# Patient Record
Sex: Female | Born: 1935
Health system: Southern US, Community
[De-identification: ages and names within clinical notes are randomized; demographics above are authoritative.]

## PROBLEM LIST (undated history)

## (undated) DIAGNOSIS — E669 Obesity, unspecified: Secondary | ICD-10-CM

## (undated) DIAGNOSIS — E039 Hypothyroidism, unspecified: Secondary | ICD-10-CM

## (undated) DIAGNOSIS — I10 Essential (primary) hypertension: Secondary | ICD-10-CM

## (undated) DIAGNOSIS — E785 Hyperlipidemia, unspecified: Secondary | ICD-10-CM

## (undated) DIAGNOSIS — R011 Cardiac murmur, unspecified: Secondary | ICD-10-CM

## (undated) DIAGNOSIS — R001 Bradycardia, unspecified: Secondary | ICD-10-CM

## (undated) DIAGNOSIS — H409 Unspecified glaucoma: Secondary | ICD-10-CM

## (undated) HISTORY — DX: Essential (primary) hypertension: I10

## (undated) HISTORY — DX: Cardiac murmur, unspecified: R01.1

## (undated) HISTORY — DX: Hypothyroidism, unspecified: E03.9

## (undated) HISTORY — DX: Obesity, unspecified: E66.9

## (undated) HISTORY — DX: Bradycardia, unspecified: R00.1

## (undated) HISTORY — DX: Unspecified glaucoma: H40.9

## (undated) HISTORY — DX: Hyperlipidemia, unspecified: E78.5

---

## 1999-03-04 ENCOUNTER — Other Ambulatory Visit: Admission: RE | Admit: 1999-03-04 | Discharge: 1999-03-04 | Payer: Self-pay | Admitting: Family Medicine

## 2002-05-06 ENCOUNTER — Inpatient Hospital Stay (HOSPITAL_COMMUNITY): Admission: AD | Admit: 2002-05-06 | Discharge: 2002-05-08 | Payer: Self-pay | Admitting: Family Medicine

## 2002-05-06 ENCOUNTER — Encounter: Payer: Self-pay | Admitting: Family Medicine

## 2002-05-07 ENCOUNTER — Encounter (INDEPENDENT_AMBULATORY_CARE_PROVIDER_SITE_OTHER): Payer: Self-pay | Admitting: Specialist

## 2002-05-07 ENCOUNTER — Encounter: Payer: Self-pay | Admitting: Family Medicine

## 2002-05-08 ENCOUNTER — Encounter (INDEPENDENT_AMBULATORY_CARE_PROVIDER_SITE_OTHER): Payer: Self-pay | Admitting: *Deleted

## 2004-08-24 ENCOUNTER — Other Ambulatory Visit: Admission: RE | Admit: 2004-08-24 | Discharge: 2004-08-24 | Payer: Self-pay | Admitting: Family Medicine

## 2005-07-07 ENCOUNTER — Encounter (INDEPENDENT_AMBULATORY_CARE_PROVIDER_SITE_OTHER): Payer: Self-pay | Admitting: *Deleted

## 2005-07-07 ENCOUNTER — Ambulatory Visit (HOSPITAL_COMMUNITY): Admission: RE | Admit: 2005-07-07 | Discharge: 2005-07-07 | Payer: Self-pay | Admitting: Family Medicine

## 2006-07-27 HISTORY — PX: NM MYOVIEW LTD: HXRAD82

## 2006-08-21 ENCOUNTER — Inpatient Hospital Stay (HOSPITAL_COMMUNITY): Admission: RE | Admit: 2006-08-21 | Discharge: 2006-08-24 | Payer: Self-pay | Admitting: Orthopedic Surgery

## 2006-08-21 HISTORY — PX: REPLACEMENT TOTAL KNEE: SUR1224

## 2006-10-04 ENCOUNTER — Encounter (HOSPITAL_COMMUNITY): Admission: RE | Admit: 2006-10-04 | Discharge: 2007-01-02 | Payer: Self-pay | Admitting: Internal Medicine

## 2007-06-25 HISTORY — PX: US ECHOCARDIOGRAPHY: HXRAD669

## 2007-08-27 ENCOUNTER — Inpatient Hospital Stay (HOSPITAL_COMMUNITY): Admission: RE | Admit: 2007-08-27 | Discharge: 2007-08-30 | Payer: Self-pay | Admitting: Orthopedic Surgery

## 2010-02-10 ENCOUNTER — Encounter: Admission: RE | Admit: 2010-02-10 | Discharge: 2010-02-10 | Payer: Self-pay | Admitting: Gastroenterology

## 2010-07-11 ENCOUNTER — Encounter: Payer: Self-pay | Admitting: Gastroenterology

## 2010-11-02 NOTE — Op Note (Signed)
Alexandra Dixon, Alexandra Dixon               ACCOUNT NO.:  [id number]   MEDICAL RECORD NO.:  [id number]          PATIENT TYPE:  INP   LOCATION:  2550                         FACILITY:  [facility]   PHYSICIAN:  Mila Homer. Sherlean Foot, M.D. DATE OF BIRTH:  1935/06/30   DATE OF PROCEDURE:  08/27/2007  DATE OF DISCHARGE:                               OPERATIVE REPORT   SURGEON:  Mila Homer. Sherlean Foot, M.D.   ASSISTANTArlys John D. Petrarca, P.A.-C.   ANESTHESIA:  General.   PREOPERATIVE DIAGNOSIS:  Left knee osteoarthritis.   POSTOPERATIVE DIAGNOSIS:  Left knee osteoarthritis.   PROCEDURE:  Left total knee arthroplasty.   INDICATIONS FOR PROCEDURE:  The patient is 75 year old black female  __________  managed for osteoarthritis and a successful total knee done  on the other side.  Informed consent was obtained.   DESCRIPTION OF PROCEDURE:  The patient was laid supine, administered  general anesthesia.  Foley catheter placed.  Left leg was prepped and  draped in usual sterile fashion.  The extremity was exsanguinated with  the Esmarch and the tourniquet inflated to 375 mmHg.  I then made a  midline incision with a #10 blade.  I used a new blade to make a median  parapatellar arthrotomy and performed synovectomy.  I then elevated the  deep MCL off the medial crest of the tibia as well as doing a bit of a  varus release since this was a significantly varus knee.  There was a  lot of medial tibial plateau bone loss.  I then used patellar reamer to  ream down to the thickness of 11-12 mm, drilled three lug holes and with  29 mm trial prosthesis in place, recreated a 20 mm thickness.  Removed  the prosthetic trial and went into flexion.  I used extramedullary guide  on the tibia to make a perpendicular cut to the anatomic axis of the  tibia.  I used the intramedullary guide on the femur make a 6 degree  valgus cut.  Sized to size D pin through the 3 degree external rotation  holes.  Placed the four-in-one cutting  block __________  the anterior,  posterior and chamfer cuts.  I then placed a lamina spreader in the knee  and removed the ACL, PCL, medial and lateral menisci.  I then placed the  various polyethylene trials in the knee and a 14 offered good  flexion/extension gap balance.  Then finished this femur with a size D  finishing block and the tibia with a size 3 tibial tray, drill and keel.  The size 14 worked best.  It was really in between 14 and 17.  I  accomplished balance by allowing about a 1 mm cement mantle distally on  the tibia and the femur as we cemented in the components.  I then  cemented in the final components, D femur, 3 tibia, put in a 14 insert  and a 29 patella.  Removed all excess cement, allowed cement harden in  extension.  I then let the tourniquet down, copiously irrigated and  obtained hemostasis.  I left a Hemovac  coming out superolaterally and  deep to the arthrotomy, pain catheter coming out supermedial and  superficial to the arthrotomy.  Closed the arthrotomy with figure-of-  eight #1 Vicryl sutures, deep soft tissues with buried zero Vicryl  sutures, subcuticular 2-0 Vicryl stitch and skin staples.  I dressed  with Xeroform dressing sponges, sterile Webril and TED stocking.   TOURNIQUET TIME:  One hour and 1 minute.   ESTIMATED BLOOD LOSS:  300.   DRAINS:  One Hemovac and one pain catheter.           ______________________________  Mila Homer. Sherlean Foot, M.D.     SDL/MEDQ  D:  08/27/2007  T:  08/28/2007  Job:  [id number]

## 2010-11-05 NOTE — Op Note (Signed)
NAMEWANDALEE, Alexandra Dixon               ACCOUNT NO.:  [id number]   MEDICAL RECORD NO.:  [id number]          PATIENT TYPE:  INP   LOCATION:  2899                         FACILITY:  [facility]   PHYSICIAN:  Mila Homer. Sherlean Foot, M.D. DATE OF BIRTH:  1936-02-19   DATE OF PROCEDURE:  08/21/2006  DATE OF DISCHARGE:                               OPERATIVE REPORT   SURGEON:  Georgena Spurling, M.D.   ASSISTANT:  Richardean Canal, P.A.   ANESTHESIA:  General.   PREOPERATIVE DIAGNOSIS:  Right knee osteoarthritis.   POSTOPERATIVE DIAGNOSIS:  Right knee osteoarthritis.   PROCEDURE:  Right total knee arthroplasty.   INDICATIONS FOR PROCEDURE:  The patient is a 75 year old white female  with failure of conservative measures for osteoarthritis of the knee.  Informed consent was obtained.   DESCRIPTION OF PROCEDURE:  The patient was laid supine and under general  anesthesia a femoral nerve block and Foley catheter placement.  The leg  was then prepped and draped in the usual sterile fashion.  A midline  incision was made with a #10 blade.  This was approximately 7 inches in  length.  A fresh blade was used to make a medial parapatellar arthrotomy  and perform a synovectomy.  Deep MCL was elevated off the medial crest  of the tibia and a bit of a varus release was performed since the knee  was in incredible.  In fact, the preoperative range of motion was 10-80  only with about 20 degrees of varus.   I then went into flexion and I used the extramedullary alignment system  to make a perpendicular cut to the anatomic axis of the tibia.  I  everted the patella at this point and measured at 20 mm, reamed down to  11 mm, drilled three lug holes through the 29-mm template.  With the 29  prosthesis trial in place it also measured 20 mm.  I then went into  flexion made an intramedullary drill hole in the femur.  I used the 6  degree intramedullary guide, pinned it at 0 degrees and made the cut  through the distal  femoral cutting block with a sagittal saw in 6  degrees of valgus.   I then marked out the epicondylar axis, posterior condylar angle  measured 3 degrees.  I then sized to a size D pin through the 3 degrees  external rotation holes.  Then used a D cutting block and made the  anterior-posterior chamfer cuts through the 4:1 cutting block with a  sagittal saw.  I removed the bony pieces and removed the cutting block.  I then placed a lamina spreader in the knee and removed the posterior  condylar osteophytes, medial lateral menisci, ACL, PCL.  I then trialed  with a size 10 and 12 lollipop spacer blocks.  Had excellent  flexion/extension gap balance with 12 complete extension.  I then  finished the tibia with a size 3 tibial tray drilling the keyhole.  I  finished the femur with a size D finishing block cutting for the lugs  and blocks.  I then trialed with a  3 tibia, D femur, 12 insert, 29  patella and had excellent flexion/extension gap balance.  Maximum  flexion was about 95 degrees and this was obviously due to nothing  posteriorly, but anteriorly on the quadriceps tendon it was very, very  tight.  I really did not feel that there was any releasing that could be  done there that would not compromise the quadriceps tendon.  I then  removed the trial components, copiously irrigated and then cemented in  the 3 tibia, d femur, snapped in the trial polyethylene and removed all  the excess cement, cemented in the patella and removed the excess cement  there as well and let the cement harden in extension.   I placed a Hemovac coming out superolaterally and the deep arthrotomy  pain catheter coming out superomedially and superficially to the  arthrotomy.  Once the cement was hardened we let the tourniquet down.  Cauterize to obtain hemostasis with the cautery and then irrigated  again.  We then closed the arthrotomy with interrupted figure-of-eight  #1 Vicryl sutures, the deep soft tissues  with interrupted 0 Vicryl  sutures, the subcuticular with 2-0 stitches and staples.  Driving angle  was 95 degrees.  The tourniquet time was 54 minutes the dressings,  sponges, Xeroform, Webril and TED stockings.   COMPLICATIONS:  None.   ESTIMATED BLOOD LOSS:  300 mL.           ______________________________  Mila Homer. Sherlean Foot, M.D.     SDL/MEDQ  D:  08/21/2006  T:  08/21/2006  Job:  [id number]

## 2010-11-05 NOTE — Op Note (Signed)
NAME:  Alexandra Dixon, Alexandra Dixon                         ACCOUNT NO.:  [id number]   MEDICAL RECORD NO.:  [id number]                   PATIENT TYPE:  INP   LOCATION:  5501                                 FACILITY:  [facility]   PHYSICIAN:  Anselmo Rod, M.D.               DATE OF BIRTH:  May 14, 1936   DATE OF PROCEDURE:  05/07/2002  DATE OF DISCHARGE:                                 OPERATIVE REPORT   PROCEDURE PERFORMED:  Esophagogastroduodenoscopy with biopsies.   ENDOSCOPIST:  Anselmo Rod, M.D.   INSTRUMENT USED:  Olympus video panendoscope.   INDICATIONS FOR PROCEDURE:  [age]-year-old African-American female with  a history of arthritis on Sulindac with rectal bleeding, rule out ulcer  disease.   PREPROCEDURE PREPARATION:  Informed consent was procured from the patient.  The patient fasted eight hours prior to the procedure.  A bolus of IV fluids  was given to her prior to the procedure because of hypotension with blood  pressure of 85/50. The patient has serial CBCs done and hemoglobin dropped  from 11.8 to 10.6 prior to procedure.   PREPROCEDURE PHYSICAL:  VITAL SIGNS:  Patient with stable vital signs.  NECK:  Supple.  LUNGS:  Clear to auscultation.  ABDOMEN:  Soft with normal bowel sounds.   DESCRIPTION OF THE PROCEDURE:  The patient was placed in the left lateral  decubitus position, sedated with 50 mg of Demerol and 5 mg of Versed  intravenously.  Once the patient was adequately sedated and maintained on  low-flow oxygen, and continuous cardiac monitoring the Olympus video  panendoscope was advanced through the mouthpiece, over the tongue and into  the esophagus under direct vision.  The entire esophagus appeared normal  without evidence of ring, stricture, masses, esophagitis, or varices in the  mucosa.  The scope was then advanced into the stomach.  There were multiple  antral erosions seen.  There was evidence of duodenitis in the duodenal bulb  with small erosions  as well.  The small bulb distal to the bulb appeared  normal up to 60 cm.  The rest of the gastric mucosa including the proximal  stomach and mid body appeared normal.   IMPRESSION:  1. Normal appearing esophagus.  2. Multiple antral and duodenal bulb erosions.  3. Moderate duodenitis in the duodenal bulb.  4. Biopsy done from the antrum to rule out Helicobacter pylori  pathology.   RECOMMENDATIONS:  1. Await pathology results.  2. Discontinue all nonsteroidals including aspirin.  3. Hold antihypertensives for now (as discussed with Dr. Parke Simmers over the     phone).  4. Proceed with the colonoscopy in A.M.  5.     Treat with COX II inhibitors for arthritis if okay with Dr. Parke Simmers.  6. Continue serial CBCs.  7. Transfuse if needed to keep the hematocrit above 25%.  Anselmo Rod, M.D.    JNM/MEDQ  D:  05/07/2002  T:  05/08/2002  Job:  [id number]   cc:   Renaye Rakers, M.D.  [phone] [address]  [place]  [place] [address]  Fax: [phone]   Leonie Man, M.D.  [address], [address]  [place]  [place] [address]  Fax: [phone]

## 2010-11-05 NOTE — Discharge Summary (Signed)
NAMEROSIE, TORREZ               ACCOUNT NO.:  [id number]   MEDICAL RECORD NO.:  [id number]          PATIENT TYPE:  INP   LOCATION:  5003                         FACILITY:  [facility]   PHYSICIAN:  Mila Homer. Sherlean Foot, M.D. DATE OF BIRTH:  [birth year]   DATE OF ADMISSION:  08/27/2007  DATE OF DISCHARGE:  08/30/2007                               DISCHARGE SUMMARY   ADMISSION DIAGNOSES:  1. End-stage osteoarthritis left knee status post right knee      replacement.  2. Hypertension.  3. History of atrial fibrillation due to thyroid gland after a right      total knee.  4. Hypercholesterolemia.  5. Urinary incontinence.  6. Obesity.   DISCHARGE DIAGNOSES:  1. End-stage osteoarthritis, left knee, status post left total knee      arthroplasty.  2. History of right total knee arthroplasty.  3. Acute blood loss anemia secondary to surgery.  4. Obesity.  5. Acute blood loss.  6. Hypertension.  7. History of atrial fibrillation secondary to thyroid.  8. Hypercholesterolemia.  9. Urinary incontinence.  10.Obesity.   SURGICAL PROCEDURES:  1. On August 27, 2007,  Ms. Panameno underwent a left total knee      arthroplasty by Dr. Mila Homer. Lucey assisted by Oris Drone. Petrarca,      PA-C.  2. She had a NexGen All-Poly patella standard size 29 and 8-mm      thickness placed with a NexGen Legacy Knee LPS femoral component      size D left.  3. A fluted stem tibial component size 3 with a NexGen Legacy Knee LPS      articular surface size yellow CD 14 mm height.   COMPLICATIONS:  None.   CONSULTANT:  1. Physical therapy consult on August 28, 2007.  2. Nutrition consult and case management consult on August 28, 2007,      and August 29, 2007.  3. Occupational therapy consult on August 29, 2007.   HISTORY OF PRESENT ILLNESS:  A 75 year old black female patient  presented to Dr. Sherlean Foot with a history of right knee replacement in March  2008.  She has had a 10-year history of gradual-onset progressive  left  knee pain with stiffness.  She has had no known injury or prior surgery  to the knee.  The pain is an intermittent, sharp sensation over the  medial joint without radiation.  It increases with walking, decreases  with rest and gentle range of motion.  The knee is stiff, it catches,  and it grinds.  She has failed conservative treatment, and because of  that, she is presenting for a left knee replacement.   HOSPITAL COURSE:  Ms. Kitamura tolerated her surgical procedure well  without immediate postoperative complications.  She was transferred to  5000.  On postop day #1, she was afebrile, vitals stable, hemoglobin  9.5, and hematocrit 27.9.  She was tolerating CPM.  She was weaned off  her PCA and oxygen, and started on therapy per protocol.   Postop day #2, T-max 97, vitals stable, hemoglobin 8.9, and hematocrit  25.9.  Dressing was  changed. Wound was well-approximated with staples.  Foley was discontinued.  She was continued on therapy.  Plans were made  home for possible discharge that evening, but she did not do well enough  with therapy, so she remained till the next day.   On August 30, 2007, she did have some dizziness when out of bed.  Hemoglobin was 8.6, hematocrit 25.3.  She was started on Trinsicon,  vitals were otherwise stable.  Leg was neurovascularly intact.  It was  felt at this time she was ready for discharge home, and she did well  enough with therapy, she was able to be discharged home later that day.   DISCHARGE INSTRUCTIONS:  DIET:  She can resume her regular  prehospitalization diet.   MEDICATIONS:  She may resume her home meds as follows:  1. Lisinopril 20/25 mg 1 tablet p.o. every a.m.  2. Crestor 10 mg p.o. every a.m..  3. Synthroid 50 mg p.o. every a.m.  4. Aspirin, she is to hold at this time.  5. Xalatan 1 drop to both eyes nightly.   ADDITIONAL MEDICATIONS:  At this time include:  1. Percocet 5/325 one to two tablets p.o. q.4 h. p.r.n. for pain.   2. OxyContin 20 mg 1 tablet p.o. q.12 h.  3. Robaxin 500 mg 1 tablet p.o. q. 6-8 hours p.r.n. for spasms.  4. Lovenox 40 mg subcu q. 8 a.m.  Last dose on August 30, 2007.  She is      to resume her aspirin on August 31, 2007.  5. Iron supplement twice a day for 1 month.   ACTIVITY:  She can be out of bed, weightbearing as tolerated on the left  leg with the use of a walker.  She is to have home CPM 0-90 degrees and  home health PT per [place].  No lifting or driving for 6 weeks.  She may  shower this Friday.  Please see the blue total knee discharge sheet for  further activity instructions.   WOUND CARE:  She is to change her left knee dressing daily.  Please see  the blue total knee discharge sheet for further wound care instructions.   FOLLOWUP:  She is to follow up with Dr. Sherlean Foot in her office on September 11, 2007, and needs to call [phone] for that appointment.   LABORATORY DATA:  Hemoglobin and hematocrit ranged from 12.5 and 36.8 on  August 23, 2007, to 8.6 and 25.3 on August 30, 2007.  White count and  platelets remained within normal limits.   Glucose ranged from 96 on the August 23, 2007, to 126 on August 28, 2007,  to 120 on August 30, 2007.   Estimated GFR ranged from 59 on August 23, 2007, to 51 on August 30, 2007.  Calcium dropped to a low of 8.2 on August 30, 2007.   UA on August 23, 2007, showed negative nitrate, moderate leukocyte  esterase, few epithelials, 7-10 white cells, and 0-2 red cells.  Repeat  UA on August 27, 2007, showed negative nitrate, moderate leukocyte  esterase, rare epithelials, 3-6 white cells, and no red cells.  Urine  culture on August 23, 2007, grew out greater than 100,000 colonies per mL  multiple morphotypes.  All other laboratory studies were within normal  limits.      Legrand Pitts Duffy, P.A.    ______________________________  Mila Homer. Sherlean Foot, M.D.    KED/MEDQ  D:  09/19/2007  T:  09/20/2007  Job:  [id number]

## 2010-11-05 NOTE — Op Note (Signed)
NAME:  HETVI, SHAWHAN                         ACCOUNT NO.:  [id number]   MEDICAL RECORD NO.:  [id number]                   PATIENT TYPE:  INP   LOCATION:  5501                                 FACILITY:  [facility]   PHYSICIAN:  Anselmo Rod, M.D.               DATE OF BIRTH:  December 29, 1935   DATE OF PROCEDURE:  05/08/2002  DATE OF DISCHARGE:                                 OPERATIVE REPORT   PROCEDURE:  Colonoscopy with biopsies from the left colon and terminal ileum  .   ENDOSCOPIST:  Anselmo Rod, M.D.   INSTRUMENT USED:  Pediatric adjustable Olympus colonoscope.   INDICATION FOR PROCEDURE:  Rectal bleeding in a 75 year old African-American  female with a history of Sulindac use for arthritis of her right knee.  Rule  out colonic polyps, masses, hemorrhoids, etc.   PREPROCEDURE PREPARATION:  Informed consent was procured from the patient.  The patient was fasted for eight hours prior to the procedure and prepped  with a bottle of magnesium citrate and a gallon of NuLytely the night prior  to the procedure.   PREPROCEDURE PREPARATION:  VITAL SIGNS:  The patient had stable vital signs.  NECK:  Supple.  CHEST:  Clear to auscultation.  S1, S2 regular.  ABDOMEN:  Soft with normal bowel sounds.   DESCRIPTION OF PROCEDURE:  The patient was placed in the left lateral  decubitus position and sedated with 50 mg of Demerol and 5 mg of Versed  intravenously.  Once the patient was adequately sedate and maintained on low-  flow oxygen and continuous cardiac monitoring, the Olympus video colonoscope  was advanced from the rectum to cecum and terminal ileum with slight  difficulty.  There was some residual stool in the colon.  Multiple washes  were done.  The appendiceal orifice and the ileocecal valve were clearly  visualized and photographed.  The terminal ileum showed patchy areas of  erythema.  These were biopsied for pathology.  There was evidence of  friability and ulceration of the  mucosa in patches from 45-50 cm, consistent  with ischemic colitis.  Biopsies were done from this area as well to confirm  the diagnosis.  There was a significant amount of residual stool in the  right and transverse colon.  Multiple washes were done.   IMPRESSION:  1. Patchy erythema in the terminal ileum of unclear etiology, biopsy done.  2. Ischemic colitis from 45-50 cm, biopsies done, results pending.  3. Otherwise normal-appearing colon from 0-45 cm and beyond 50 cm up to the     cecum.   RECOMMENDATIONS:  1. Await pathology results.  2. Avoid all nonsteroidals, including aspirin, for now.  3.     Hold antihypertensive medications for the next 48 hours.  4. Resume a regular diet.  5. Outpatient follow-up within the next seven to 10 days or earlier if need     be for  further recommendations.                                               Anselmo Rod, M.D.    JNM/MEDQ  D:  05/08/2002  T:  05/08/2002  Job:  [id number]   cc:   Renaye Rakers, M.D.  [phone] [address]  [place]  [place] [address]  Fax: [phone]

## 2010-11-05 NOTE — Discharge Summary (Signed)
[name], [name] NO.:  [id number]   MEDICAL RECORD NO.:  [id number]          PATIENT TYPE:  INP   LOCATION:  2028                         FACILITY:  [facility]   PHYSICIAN:  Mila Homer. Sherlean Foot, M.D. DATE OF BIRTH:  [birth year]   DATE OF ADMISSION:  08/21/2006  DATE OF DISCHARGE:  08/24/2006                               DISCHARGE SUMMARY   ADMITTING DIAGNOSES:  1. Osteoarthritis bilateral knees.  2. Hypertension.  3. Degenerative disc disease lumbar spine.  4. Dyslipidemia.  5. Glaucoma.  6. NICKEL ALLERGY.   DISCHARGE DIAGNOSES:  1. Status post right total knee arthroplasty.  2. Left knee osteoarthritis.  3. Postoperative bradycardia, resolved.  4. Acute blood loss anemia secondary to surgery requiring blood      transfusion.  5. Hypothyroidism to be treated as outpatient.  6. Dyslipidemia.   HISTORY OF PRESENT ILLNESS:  Alexandra Dixon is a 75 year old female with  bilateral knee pain for approximately ten years.  Right knee currently  worse than left.  Right knee pain described as intermittent.  Mostly  occurring with ambulation.  No wake pain.  No ________.  Mechanical  symptoms positive for catching and locking.  No radiation of pain down  either leg.  X-rays of the right knee showed severe tricompartmental  osteoarthritis.  Patient admitted to undergo right total knee  arthroplasty.   ALLERGIES:  No known drug allergies.  DOES HAVE NICKEL ALLERGY.  NO  PROBLEMS WITH HIGH-GRADE METALS, MOSTLY SHE DEVELOPS RASH WITH _______.   SURGICAL PROCEDURE:  Patient was taken to the operating room on August 21, 2006 by Dr. Georgena Spurling assisted by Richardean Canal, PA-C.  Patient was  placed under general anesthesia and underwent a right total knee  arthroplasty using the following components:  A size D femoral  component, size 3 tibial fluted stem, a 12 mm and a size 29 8.0 mm  thickness All-poly patella.  Patient tolerated the procedure well and  returned to the  recovery room in good stable condition.   CONSULTS:  The following consults were obtained while the patient was  hospitalized:  1. Cardiology.  2. Medicine.  3. PT/OT.  4. Case management.   HOSPITAL COURSE:  Postoperatively, patient was seen by [facility] secondary to patient having asymptomatic bradycardia  with ________.  Cardiac enzymes were drawn and patient was transferred  to telemetry.  H&H was 10.0 and 28.7.  Otherwise, the patient denied any  chest pain, shortness of breath, some nausea day prior which was  resolving.  Patient was tolerating diet well.  Cardiology consult was  called due to patient's bradycardia.  Postop day one, patient denied any  chest pain, shortness of breath, some nausea the day prior which  resolved.  The patient's H&H was 10.0 and 28.0, afebrile, pulse 60,  respiratory rate 18, blood pressure 105/79, O2 saturation 99% on two  liters and patient was transferred to telemetry bed.  The patient was  seen by cardiology secondary to patient's bradycardia with a heart rate  of 72.  Postop day two, patient with no  chest pain, shortness of breath.  Tolerating diet well, pain under control with oral medications.  Patient  afebrile, vital signs stable, normal sinus rhythm.  H&H 8.3 and 2.39,  potassium was 4.1, questionable hypothyroidism, TSH was 0.034.  It was  noted on patient's outpatient cardiac cath a defect consistent with  breast attenuation artifact, cannot exclude anterior apical ischemia.  This was said to need workup as an outpatient per cardiology before the  patient became symptomatic.  Patient without hyperglycemia, hemoglobin  A1c was not checked.  Patient transfused two units of packed red blood  cells secondary to her acute blood loss anemia secondary to surgery.  Medicine consult was obtained due to patient's abnormal thyroid function  test and it was felt that this should be followed up as an outpatient  due to the  stresses of hospitalization now rather than start treatment.   Postop day three, patient doing well, no chest pain, shortness of  breath, tolerating diet.  Pain under good control, progressing along  with physical therapy, afebrile, vital signs stable, H&H improved at  10.5 and 30.7 status post two units of packed red blood cells.  Patient  was participating with physical therapy step training and was to be  discharged later that day in good stable condition.   LABS:  Routine labs on admission:  CBC:  White count was 4,600,  hemoglobin was 12.1, hematocrit 35.0, platelets 275.   Coags on admission:  PT 13.5, INR 1.0, PTT was 29.   Routine chemistries on admission:  Sodium 142, potassium 4.6, chloride  106, bicarb 28, glucose was 27, BUN was 30, creatinine was 0.96.   Hepatic enzymes on admission:  TP was 6.7, ALP was 39 gm/dl, AST was 25  U/L, ALT was 77 U/L, TBIL was 1.0 mg/dl.   Hemoglobin A1c dated August 23, 2006 was 6.1.  Cardiac enzymes dated August 21, 2006:  CK was 84, CK-MB 1.5 and troponin I was 0.01.   Second set of enzymes on August 22, 2006:  CK was 81, CK-MB 1.5, troponin  I was 0.01.   Third set of cardiac enzymes on August 22, 2006:  CK was 75, CK-MB 1.2,  troponin I was 0.01.   Thyroid testing on August 22, 2006:  TSH was low at 0.034.   Next set of thyroid tests performed on August 23, 2006:  T4 was 3.5 high.  TSH was low at 0.037.  Free T4 was high at 2.18.  T3 was normal at 113.7  mg/dl.  Free T3 was 2.48 mg/mL normal.   Urinalysis on admission:  Leukocyte esterase moderate, ________ cells  few, white blood cells 7-10, bacteria was few.   Urine culture on August 15, 2004, showed 60,000 colonies/mL, multiple  species, felt to be contaminant.   EKG dated August 21, 2006 showed marked sinus bradycardia with a rate of  46 beats per minutes, P-R interval was 194 msec, _______ 37.6.  DISCHARGE INSTRUCTIONS:  Patient is to resume the following home meds:  1. Vesicare  500 mg one daily.  2. Lisinopril/HCTZ 20/25 mg one daily.  3. Vytorin 10/40 one tab daily.  4. Xalatan 0.005% on drop at night both eyes.  5. Add Lovenox 40 mg one injection daily, 10:00 a.m. last dose September 03, 2006.  6. Percocet 5/325 one to two tabs every four to six hours for pain.  7. Laxative stool softener OTC as needed.   DIET:  No restrictions.  ACTIVITY:  Patient is weightbearing as tolerated with walker.   WOUND CARE:  Patient is to keep wound clean, dry and change dressing  daily.  May shower after two days if no drainage, call with any signs of  infection.  TED hose during the day, use these for night, hold dressing  in place.   FOLLOWUP:  1. Patient needs followup with Dr. Sherlean Foot 11 days from discharge,      patient is to call [phone] for appointment.  2. Patient is to follow up with primary care physician, Dr. Parke Simmers, in      three to seven days for hyperthyroidism, patient is to call for      appointment.  3. Patient is to follow up with Dr. Elsie Lincoln as instructed by      cardiology.   CONDITION ON DISCHARGE:  Patient was discharged to home in good stable  condition.      Richardean Canal, P.A.    ______________________________  Mila Homer. Sherlean Foot, M.D.    GC/MEDQ  D:  10/24/2006  T:  10/24/2006  Job:  [id number]   cc:   Renaye Rakers, M.D.  Madaline Savage, M.D.

## 2010-11-05 NOTE — Consult Note (Signed)
NAME:  Alexandra Dixon, NUDELMAN NO.:  [id number]   MEDICAL RECORD NO.:  [id number]                   PATIENT TYPE:  INP   LOCATION:  5501                                 FACILITY:  [facility]   PHYSICIAN:  Blanch Media, M.D.             DATE OF BIRTH:  06-18-1936   DATE OF CONSULTATION:  05/07/2002  DATE OF DISCHARGE:                                   CONSULTATION   REASON FOR CONSULTATION:  Bright red blood per rectum.   ASSESSMENT:  1. Rectal bleeding, rule out peptic ulcer disease, colonic polyp masses.  2. History of arthritis in knee and is on chronic Sulindac.  3. Right upper quadrant/epigastric pain, rule out gallbladder disease.  4. Hypertension.  5. Hypercholesterolemia.  6. History of gastroparesis.   RECOMMENDATIONS:  1. Abdominal ultrasound to evaluate epigastric pain.  HIDA scan with CK if     ultrasound is normal.  2. EGD in a.m.  3. IV proton pump inhibitor.  4. Colonoscopy if EGD is unrevealing.  5. Serial CBC.  6. Type and screen.  7. NPO after midnight.   HISTORY OF PRESENT ILLNESS:  The patient is a 75 year old black female with  past medical history significant for hypertension and arthritis.  The  patient also has a history of nonsteroidal use for several months.  The  patient presents with history of bright red blood per rectum that started  about 10 p.m. on the evening prior to admission when she had a bowel  movement and noticed the blood on the tissue paper after wiping.  The  patient states that she has had a change in her bowel movements for over one  year with a different caliber of stool.  She denies any previous  hematochezia or melena prior to this current episode leading to her  admission.  The patient has experienced some epigastric pain since Sunday,  May 04, 2002.  Episodically, she also experiences right upper quadrant  pain with nausea and vomiting that is exacerbated with greasy meals.  She  has had  symptoms like this in August of 2003 after eating greasy food.   ALLERGIES:  No known drug allergies.   MEDICATIONS:  1. Zestoretic 20/25.  2. Pravachol 80 q.d.  3. Sulindac 200 b.i.d.   FAMILY HISTORY:  No history of colon cancer.   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  The patient denies alcohol or tobacco use.   PHYSICAL EXAMINATION:  GENERAL APPEARANCE:  The patient is in no acute  distress and pleasant.  VITAL SIGNS:  Temperature 97.9, blood pressure 123/53, pulse 75, respiratory  rate 22, orthostatics negative.  HEENT:  Extraocular muscles intact.  NECK:  Supple.  No JVD, no thyromegaly.  CHEST:  Lungs are clear to auscultation bilaterally with good air movement.  CARDIOVASCULAR:  Regular rate and rhythm with no murmurs, rubs, or gallops.  ABDOMEN:  Soft with normal bowel sounds, right upper quadrant and epigastric  tenderness with guarding, no rebound.  RECTAL:  Bright red blood was reported on rectal examination per Dr. Parke Simmers.   LABORATORY DATA:  White blood cell count 7.4, hemoglobin 11.8, MCV 83.3,  platelets 284.  PTT 29, PT 13.3.  Sodium 137, potassium 4.4, chloride 104,  CO2 25, BUN 30, creatinine 1.1, glucose 103.  LFTs are normal.                                               Blanch Media, M.D.    EB/MEDQ  D:  05/07/2002  T:  05/07/2002  Job:  [id number]

## 2010-11-05 NOTE — H&P (Signed)
NAME:  Alexandra Dixon, Alexandra Dixon                         ACCOUNT NO.:  [id number]   MEDICAL RECORD NO.:  [id number]                   PATIENT TYPE:  INP   LOCATION:  5501                                 FACILITY:  [facility]   PHYSICIAN:  Anselmo Rod, M.D.               DATE OF BIRTH:  07/31/1935   DATE OF ADMISSION:  05/06/2002  DATE OF DISCHARGE:  05/08/2002                                HISTORY & PHYSICAL   REASON FOR CONSULTATION:  Bright red blood per rectum.   ASSESSMENT:  1. Rectal bleeding, rule out peptic ulcer disease and colonic polyp masses.  2. History of arthritis in the knee and is on chronic Sulindac.  3. Right upper quadrant/epigastric pain, rule out gallbladder disease.  4. Hypertension.  5. Hypercholesteremia.  6. History of gastroparesis.  7. History of peptic ulcer disease.   RECOMMENDATIONS:  1. Abdominal ultrasound to evaluate epigastric pain; HIDA scan with CCK if     ultrasound is normal.  2. Esophagogastroduodenoscopy in the morning.  3. Initiate proton pump inhibitor.  4. Colonoscopy if EGD is unrevealing.  5. Serial CBC.  6. Type and screen.  7. Keep n.p.o. after midnight.   HISTORY OF PRESENT ILLNESS:  The patient is a 75 year old black female with  a past medical history significant for hypertension and arthritis. The  patient also has a history of nonsteroidal use for several months. The  patient presents with a history of bright red blood per rectum that started  about 10 p.m. on the evening prior to admission when she  had  a bowel  movement and noticed the blood on the tissue paper after wiping. The patient  states that she has had a change in her bowel movements for over one year  with a different color of stool. She denies any hematochezia or melena prior  to  this current episode leading to her admission.   The patient has experienced some epigastric pain since Sunday, May 04, 2002. At this time she also experienced right upper quadrant  pain with  nausea and vomiting that is exacerbated with greasy meals. She has had  symptoms like this in August 2003 after  eating  greasy foods.   PAST MEDICAL HISTORY:  1. See above.  2. Appendectomy in the 1970s.  3. Normal spontaneous vaginal delivery x 2.  4. Esophagogastroduodenoscopy in 1998 showing  gastroparesis.   ALLERGIES:  No known drug allergies.   MEDICATIONS:  1. Zestoretic 20/25.  2. Pravachol 80 mg q.d.  3. Sulindac 200 mg b.i.d.   FAMILY HISTORY:  Father deceased at the age of 70 with alcoholic cirrhosis.  Her father also had  diabetes. Mother is  alive with hypertension. One  brother has diabetes. She has two sisters with removal of breast lumps. No  history of colon cancer in her family.   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 The  patient denies alcohol or tobacco use.   REVIEW OF SYSTEMS:  Other than the GI symptoms listed in the HPI, the review  of systems is negative for dysphagia or rhinorrhea, itchy eyes, otorrhea,  neck swelling, history of mitral valve prolapse, chest pain, shortness of  breath, cough, fever, dysuria, heat and cold intolerance, polyuria,  polydipsia, dyspnea, syncope, depression, anxiety, seasonal allergies and  skin changes. Review of systems is positive for knee pain and a history of  gaseous abdominal bloating, burping and flatus.   PHYSICAL EXAMINATION:  GENERAL:  The patient is in no acute distress and  pleasant.  VITAL SIGNS:  Temperature 97.9, blood pressure 123/53, pulse 75, respiratory  rate 22. Orthostatics negative.  HEENT:  Extraocular muscles intact. Normocephalic, atraumatic. Oropharynx  normal.  NECK:  Supple, no JVD, no thyromegaly.  CHEST:  Lungs clear to auscultation bilaterally with good air movement.  CARDIOVASCULAR:  Regular rate and rhythm with no murmurs, rubs, gallops.  ABDOMEN:  Soft with normal bowel sounds, right upper quadrant and epigastric  tenderness with  guarding, no rebound.  RECTAL:  Bright red blood was reported on rectal examination per Dr. Parke Simmers.   LABORATORY DATA:  White blood cells 7.4, hemoglobin 11.8, MCV 83.3,  platelets 284. PTT 29, PT 13.3. Sodium 137, potassium 4.4, chloride 104, CO2  25, BUN 30, creatinine 1.1, glucose 103. LFTs normal.     Blanch Media, M.D.                   Anselmo Rod, M.D.    EB/MEDQ  D:  05/09/2002  T:  05/09/2002  Job:  [id number]   cc:   Renaye Rakers, M.D.  [phone] [address], [address]  [place]  [place] [address]  Fax: [phone]

## 2010-11-05 NOTE — Consult Note (Signed)
Alexandra, Dixon               ACCOUNT NO.:  [id number]   MEDICAL RECORD NO.:  [id number]          PATIENT TYPE:  INP   LOCATION:  2028                         FACILITY:  [facility]   PHYSICIAN:  Beckey Rutter, MD  DATE OF BIRTH:  August 05, 1935   DATE OF CONSULTATION:  DATE OF DISCHARGE:                                 CONSULTATION   INTERNAL MEDICINE CONSULTATION:   REASON FOR CONSULTATION:  Abnormal thyroid function tests.   HISTORY OF PRESENT ILLNESS:  [name] is a 75 year old African  American female, who has a past medical history significant for end-  stage osteoarthritis of  bilateral knees, hypertension, degenerative  disk disease of the lumbar spine, and  hyperlipidemia, admitted on 3  March for right knee replacement.  During hospitalization, the patient  was found to have some bradycardia, and she was evaluated by cardiology  for that.  During hospitalization, the patient was found to have low TSH  and slightly elevated free T4.  Upon questioning, the patient admitted  to have some palpitations now and then, but she denies any cold or hot  spells.  She also admitted to having some hair change and nail change  and dry skin, but other than that, she did not notice any thyroid  symptoms.  She denied categorically any weight change, any bowel  movement change, constipation or diarrhea, and she stated she was  investigated before by her primary physician, Dr. Parke Simmers , for thyroid  function, and no medication was prescribed back then.  The patient  during this hospitalization denied any palpitation, though it is  reported that she was bradycardic, and by looking at the vital signs,  the patient history been persistently in the 60s and 50s.   MEDICATION BEFORE ADMISSION:  Lisinopril 25 daily, Nexium 10/40 daily,  Mobic 15 mg daily, tramadol 50 mg daily, PanoPlex 7.5 mg q.h.s.,  Vesicare 5 mg 1 tablet, and Dilantin and Xalatan 0.005% eye drops.   PAST MEDICAL HISTORY:  1.  As mentioned earlier, end-stage osteoarthritis of bilateral knees.      Now she is status post right knee replacement.  2. Hypertension.  3. Dyslipidemia.  4. Degenerative disk disease in the lumbar spine.  5. Early glaucoma.   PAST SURGICAL HISTORY:  Appendectomy in the 1960s.   SOCIAL HISTORY:  The patient denied alcohol abuse, tobacco abuse,  illicit drug abuse.  She lives in her home.  Her primary care physician  is Dr. Parke Simmers .  Her cardiologist is Dr. Elsie Lincoln.   FAMILY HISTORY:  Nonsignificant.   SYSTEM REVIEW:  As per HPI.   PHYSICAL EXAMINATION:  The patient is well developed, not in distress.  Head exam: atraumatic, normocephalic.  Eye exam: PERLA.  Mouth exam: no  ulcer, no dry mouth.  Neck exam:  no lymph node palpable.  The thyroid  is not enlarged.  thyroid is not tender.  Thyroid is not hot.  No JVD.  Lung exam: bilateral fair air entry.  CVS:  first and second  heart  sounds audible; no other sounds.  Abdomen: soft, nontender; bowel sounds  present.  GU:  no CVA tenderness.  Extremities:  The patient has a right  knee wrapping, status post total knee replacement.  No edema in  bilateral extremities.  No tenderness.  Skin: not dry.  Nails: no gross  abnormalities.  Hair: no area of alopecia or much hair loss.  Eyebrows  are intact.  No hair loss.   LABS:  White count 9.4, hemoglobin 8.3, hematocrit 23.9, platelets 184,  sodium 137, potassium 4.1, chloride 105, bicarb 29, BUN 22, creatinine  1.15, glucose 124.  TSH done on 22 August 2006 was 0.034.  TSH done on 23 August 2006 was 0.037, T3 2.4, and free T4 is 2.18, hemoglobin A1C 6.1.   ASSESSMENT AND PLAN:  This is a 75 year old female, with high blood  pressure and dyslipidemia and osteoarthritis, now status post right  total knee replacement with incidental bradycardia and abnormal thyroid  function tests.   First problem:  Abnormal thyroid function tests.  The TSH is low, and  the T4 is slightly elevated, which is  suggestive of hyperthyroidism, but  because the patient does not have any significant symptoms and the TSH  is barely elevated from the norm, this is likely subclinical  hyperthyroidism.  The patient just came out of surgery and those results  as per her were discussed with the primary physician and she has  regulated the thyroid function tests.  But because the patient has  recently had surgery and there is no way we could track down the  previous results, we would recommend right night to follow up with the  primary physician for further assessment of the thyroid function and  judicious consideration of the antithyroid medication at this time.  To  start her medication immediately after surgery, with all the stresses of  the hospitalization right now, is not recommended.  Rather, this medical  problem should be addressed in the outpatient basis.  The results were  discussed with the patient and we would also recommend to take  the  results of the TSH and the thyroid function tests to be provided to the  primary physician to empower her to evaluate and decide on the  management of this clinical problem.   Second problem:  Hypertension.  The patient is stable on medication.  We  will plan to continue her on that.   Third problem:  Dyslipidemia.  We will consider lipid profile, which  could be addressed as an outpatient as well.   CONCLUSIONS:  The subclinical hyperthyroidism is a problem that can be  addressed as an outpatient.  The patient could be discharged from the  medical point of view to follow up the thyroid function tests as  discussed above and discussed with her.   Thank you for the consultation.      Beckey Rutter, MD  Electronically Signed     EME/MEDQ  D:  08/23/2006  T:  08/23/2006  Job:  [id number]   cc:   Mila Homer. Sherlean Foot, M.D.

## 2010-11-05 NOTE — H&P (Signed)
Alexandra Dixon, Alexandra Dixon               ACCOUNT NO.:  [id number]   MEDICAL RECORD NO.:  [id number]           PATIENT TYPE:   LOCATION:                                 FACILITY:   PHYSICIAN:  Mila Homer. Sherlean Foot, M.D. DATE OF BIRTH:  [birth year]   DATE OF ADMISSION:  08/21/2006  DATE OF DISCHARGE:                              HISTORY & PHYSICAL   CHIEF COMPLAINT:  Right knee pain.   HPI:  Alexandra Dixon is a 75 year old female with bilateral knee pain for  approximately 10 years.  Currently, right knee pain worse than left knee  pain.  Right knee is described as intermittent pain.  Mostly occurring  with ambulation.  No waking pain.  No assistive devices.  Mechanical  assistance positive for catching and locking.  No radiation of pain up  and down the leg.  Right knee x-rays shows severe tricompartment  osteoarthritis.   ALLERGIES:  Patient has NO KNOWN DRUG ALLERGIES.  Patient DOES HAVE A  NICKEL ALLERGY and this causes a rash mostly with costume jewelry.  She  has no problems with high-grade metals.   MEDS:  1. Lisinopril 20/25 one daily in the a.m.  2. Nexium 10/40 mg once daily a.m.  3. Mobic 15 mg 1 daily in the a.m.  4. Tramadol 15 mg 1-2 daily as needed.  5. Enablex 7.5 mg q.h.s.  6. Vesicare 5 mg 1 tab q.h.s.  7. Xalatan 0.005% eye drops 1 drop both eyes q.h.s.   PAST MEDICAL HISTORY:  1. End-stage osteoarthritis, bilateral knees.  2. Hypertension.  3. Degenerative disk disease, lumbar spine.  4. Dyslipidemia.  5. Early glaucoma.   PAST SURGICAL HISTORY:  An appendectomy in 1960.  Denies any  complications or blood transfusion.   SOCIAL HISTORY:  Patient denies any tobacco use or alcohol use.  She  lives in a 3-story home with 2-steps the usual entrance.   PRIMARY CARE PHYSICIAN:  Dr. Renaye Rakers.   CARDIOLOGIST:  Dr. Elsie Lincoln.   FAMILY HISTORY:  Patient's mother deceased at age 85, a history of heart  disease, hypertension, diabetes, and kidney disease.  Father deceased  at  age 79, a history of hypertension and diabetes.  She has one brother who  is deceased at age 66, a history of heart disease, hypertension,  diabetes, kidney disease.  Two sisters both with hypertension, otherwise  healthy.   REVIEW OF SYSTEMS:  The patient wears glasses at all times.  Purported  early glaucoma.  She wears a partial, both top and bottom jawline.  Hypertension, a known heart murmur.  Reports a cardiac stress test by  Dr. Elsie Lincoln that showed no cardiac disease.  She has nocturia x1 with  frequent urination, no dysuria.  No recent fever, cold, and flu-like  symptoms.  Otherwise, review of systems negative and noncontributory.   PHYSICAL EXAM:  GENERAL:  Patient is well-developed, well-nourished,  overweight female who walks with a limp and antalgic gait.  Patient has  obvious valgus deformities in both knees.  The patient's mental affect  was appropriate, talks easily with the examiner.  Patient's  height 4  feet 11, weight 181 pounds, BMI is 36.6.  VITAL SIGNS:  Temp 97.8, pulse is 60, respiratory rate 16, blood  pressure 120/60.  CARDIAC:  Regular rate and rhythm with a 2/6 murmur heard throughout.  LUNGS:  Clear to auscultation bilaterally.  No wheezes, rhonchi, or  rales noted.  HEENT:  Head is normocephalic, atraumatic without maxillary sinus  tenderness to palpation.  Conjunctivae is pink.  PERRLA.  Sclerae  nonicteric.  EOMs are intact.  No visible external ear deformities.  TMs  are pearly gray bilaterally.  Nasal septum midline.  Nasal mucosa is  pink and moist without polyps.  Buccal mucosa is pink and moist.  Patient has had dental repair.  She does have partials, top jawline and  lower jawline.  Pharynx is without erythema or exudate.  Tongue and  uvula midline.  ABDOMEN:  No tenderness, positive bowel sounds x4 quadrants.  NECK:  Trachea is midline, no lymphadenopathy, carotids are 2+ without  bruits.  She has full range of motion of the cervical spine  without  radicular symptoms.  BACK:  No tenderness with palpation over the thoracic lumbar spine.  BREASTS/GENITOURINARY/RECTAL EXAM:  Deferred.  NEUROLOGIC:  The patient is alert and oriented x3.  Cranial nerves II  through XII are grossly intact.  Lower extremity strength testing  reveals 5/5 strength throughout.  Deep tendon reflexes are 2+ at the  pelvis and 1+ at the ankles bilaterally and are equal and symmetric.  MUSCULOSKELETAL:  Upper extremities:  The patient has full range of  motion of the upper extremities.  Upper extremities are equal and  symmetric in size and shape throughout.  Lower extremities:  The patient  has full range of motion of both hips without pain.  Left knee 0-110  degrees of flexion, no effusion, no edema is noted.  Valgus and varus  stressing reveals no laxity.  She does have a slight valgus deformity.  Right knee:  5 degrees extension lag at 90 degrees flexion.  No  effusion, no edema.  Patient with approximate 3-5 valgus deformity.  No  effusion.  Valgus and varus stressing reveals no laxity.  Has no  tenderness directly over the joint line of the right knee; however, she  does have tenderness of that region.  Calves supple and nontender.  Dorsalis pedis pulses are 2+.  She has good sensation to light touch at  toes throughout.   IMPRESSION:  1. Osteoarthritis, bilateral knees.  2. Hypertension.  3. Degenerative disk disease, lumbar spine.  4. Dyslipidemia.  5. Glaucoma.  6. Nickel allergy.   PLAN:  Patient is to undergo all preoperative labs and testing prior to  surgery.  The patient did undergo a cardiac stress test on July 27, 2006 by Dr. Elsie Lincoln which showed left ventricular size to be normal.  EF  was 76%.  The scan was read as essentially a low-risk scan.  The patient  also received medical clearance from Dr. Renaye Rakers, her primary care physician.  The patient is to undergo a right total knee arthroplasty by  Dr. Sherlean Foot on August 21, 2006  at  [facility].  Due to the patient's nickel allergy, disk is  containing the same metals, prosthesis was taped to the patient's  forearm today.  The patient is to leave it on until Friday and have it  checked in our office Friday prior to surgery.      Richardean Canal, P.A.    ______________________________  Mila Homer. Sherlean Foot, M.D.    GC/MEDQ  D:  08/16/2006  T:  08/16/2006  Job:  [id number]   cc:   Mila Homer. Sherlean Foot, M.D.

## 2010-11-05 NOTE — Discharge Summary (Signed)
NAME:  Alexandra Dixon, Alexandra Dixon                         ACCOUNT NO.:  [id number]   MEDICAL RECORD NO.:  [id number]                   PATIENT TYPE:  INP   LOCATION:  5501                                 FACILITY:  [facility]   PHYSICIAN:  Renaye Rakers, M.D.                   DATE OF BIRTH:  [birth year]   DATE OF ADMISSION:  05/06/2002  DATE OF DISCHARGE:  05/08/2002                                 DISCHARGE SUMMARY   The patient is a 75 year old female who had presented to the office after  having acute gastrointestinal bleed and probably lower GI in origin.  Her  hemoglobin at the time of her admission was 11.8 with hematocrit of 35.4  which is comparable to what was done in our office on 10/27/01.  She was then  changed recently to a different anti-inflammatory medication and questions  if that is the reason for her bleed.  We had consulted Dr. Loreta Ave and Dr. Loreta Ave  saw the patient and did perform a colonoscopy on this patient.  Colonoscopy  was done and she had endoscopy which showed normal appearing esophagus,  multiple atrial and duodenal bulb erosions, moderate duodenitis in the  duodenal bulb.  She had biopsy done which showed mild active chronic  gastritis, as well as intestinal metaplasia-dysplasia was identified.  She  also had a colonoscopy done.  The colonoscopy showed patchy erythema in the  terminal ileum of uncertain etiology, ischemic colitis from 45 to 50 cm  biopsy were done, otherwise normal appearing  from 0 to 45 cm and the  __________ 50 cm to the cecum.  The biopsy reports on this showed active  colitis with features of ischemic colitis.  Her gallbladder showed  conglomeration of sludge within the gallbladder with small cholesterol  polyps also noted within the gallbladder.  Normal size common bile duct and  otherwise normal study with suboptimal visualization.  She also had a chest  x-ray which was negative.  Her hemoglobin at the time of admission was 10.1  and 30.3 and H&H at  the time of her discharge was 11.8 and 35.4.  11/17,  11/18 it was 10.1 and 30.2 and, for discharge 11/19 was 10.1 and 30.1 and  she did not continue to bleed during the course of her hospitalization.  BUN  was elevated at 30 when she first came in and then went down to 10.  The  patient is thus discharged from the hospital with a diagnosis of:  1. Acute GI bleed probably as a result of nonsteroidal anti-inflammatory     medications for patient's chronic arthritis.    PLAN:  At present is to of course dc all nonsteroidal anti-inflammatory  medications on this patient and probably prevent her from going on any  others.  At the present time keep her off of all of these type of  medications and within the near future to  put her back on a COX2 medication.  We will continue her on her blood pressure medications in the outpatient  basis.  Her blood pressure was somewhat low during the hospitalization and  we will re-evaluate her in the outpatient basis and decide whether to  continue those or not later on.  We will continue to monitor her H & H, will  continue her on her cholesterol medications.  She also has __________ and we  will continue her on both medications.  She will also be referred to Dr.  Catalina Antigua again on the outpatient basis to address the sludge in her  gallbladder.                                               Renaye Rakers, M.D.    VB/MEDQ  D:  07/11/2002  T:  07/12/2002  Job:  [id number]

## 2010-11-05 NOTE — H&P (Signed)
NAME:  Alexandra Dixon, Alexandra Dixon                         ACCOUNT NO.:  [id number]   MEDICAL RECORD NO.:  [id number]                   PATIENT TYPE:  INP   LOCATION:  5501                                 FACILITY:  [facility]   PHYSICIAN:  Renaye Rakers, M.D.                   DATE OF BIRTH:  05/10/1936   DATE OF ADMISSION:  05/06/2002  DATE OF DISCHARGE:                                HISTORY & PHYSICAL   The patient is a 75 year old female who was on an airplane and having some  shrimp when she noticed that she was having some right upper quadrant pain.  It would come and then it would leave and go away.  She reports that after  returning last night she got it again and she was up vomiting all during the  night.  There was no blood in the vomitus.  When she then went to the  bathroom she noted bright red blood on two separate occasions in her stools.  With this she called the office and was brought in to be seen.  An  endoscopic examination done in the office showed that there were small clots  present in the rectum area.  With this, Dr. Loreta Ave who had done an endoscopy  on this patient in 1998 was consulted.   PAST MEDICAL HISTORY:  1. Hypertension.  2. Increased cholesterol.  3. Arthritis of the knee.  4. Gastroesophageal reflux.  5. Overactive bladder.  6. Decreased WBC.  7. Appendicitis.  8. Childbirth x 2.  9. Glaucoma.   ALLERGIES:  None are known.   MEDICATIONS:  1. Clinoril 200 mg b.i.d.  2. Zestoretic 20/25 one p.o. q.d.  3. Pravachol 80 mg one q.d.  4. Prevacid 30 mg one q.d.  5. Xalatan 0.005% eye drops one to each eye q.h.s.   REVIEW OF SYSTEMS:  Shows that patient has had a concerted effort to lose  weight.  She has lost 60 pounds in the last three years in a slow manner.  HEENT:  Negative except for her glaucoma.  GI:  Her gastroesophageal reflux  symptoms.  GU:  Possible overactive bladder symptoms.  PULMONARY:  Negative.  CARDIAC:  Negative.   FAMILY HISTORY:  Positive  for cerebrovascular accident in her father,  arthritis in the patient and father, hypertension in patient and sisters.  Father died of cirrhosis of the liver.  Mother is alive but she has started  to have problems with Alzheimer's.   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  She is married and has been married for many years.  She has two grown children.  She does not smoke or drink.   PHYSICAL EXAMINATION:  VITALS:  She has a temperature of 97.9, blood  pressure 122/55, pulse is 75, respirations are 22.  GENERAL:  A well-developed, well-nourished white female in  no acute  distress.  HEAD:  Normocephalic.  EYES:  Extraocular movements are intact.  Her pupils are equal, round,  reactive to light.  The discs are flat.  EARS:  Negative.  NOSE:  Negative.  MOUTH:  Shows good dentition.  Mucosa is moist.  NECK:  Supple, no thyromegaly, no carotid bruits.  CHEST:  Clear anterior and posterior.  COR:  Regular, no S3, no S4.  No thrills or heaves.  ABDOMEN:  Soft, bowel sounds were positive, negative for masses or  organomegaly.  Negative for increase in spleen.  She had slight epigastric  tenderness.  EXTREMITIES:  Show some deformities of the left knee, otherwise no  deformities were noted.  NEUROLOGICAL:  She is alert, oriented x 3, moving all extremities.  RECTAL:  Positive for blood and clots in the office.  BREAST EXAM:  Was deferred.   LABORATORY DATA:  Shows white count of 7.4, hemoglobin 11.8, hematocrit 35.4  that compares with lab work that was done in the office on 10/30 that showed  her to have a white count of 2.7 but a hemoglobin of 11.8 and hematocrit of  35.7, so that compared quite well.  Her PT of 13.2, INR  1.0, PTT of 24,  sodium 137, potassium 4.4, chloride 104, CO2 25, glucose 103, BUN 30,  creatinine 1.1, total bilirubin 1.0, alkaline phosphatase 95, SGOT 24, SGPT  23, total protein 7.3, albumin 4.1,  calcium 10.0.  Acute abdomen is pending.   ASSESSMENT:  Basically we have a 75 year old female who presented to the  office after having had an acute gastrointestinal bleed that was probably of  the lower gastrointestinal tract.  Question at this point is what is this  from.  Is it from her non-steroidal anti-inflammatory drugs or is there  other etiology to this?  We will go ahead and monitor this patient  carefully, getting serial hemoglobin and hematocrits on her.  Also monitor  her hypertension and she will have an endoscopy and colonoscopy as directed  by Dr. Loreta Ave.                                               Renaye Rakers, M.D.    VB/MEDQ  D:  05/06/2002  T:  05/06/2002  Job:  [id number]

## 2011-03-14 LAB — URINALYSIS, ROUTINE W REFLEX MICROSCOPIC
Hgb urine dipstick: NEGATIVE
Nitrite: NEGATIVE
Protein, ur: NEGATIVE
Specific Gravity, Urine: 1.012
Urobilinogen, UA: 0.2
Urobilinogen, UA: 0.2

## 2011-03-14 LAB — CBC
HCT: 25.3 — ABNORMAL LOW
Hemoglobin: 8.6 — ABNORMAL LOW
Hemoglobin: 8.9 — ABNORMAL LOW
Hemoglobin: 9.5 — ABNORMAL LOW
MCHC: 34
MCHC: 34.2
MCV: 83
Platelets: 256
RBC: 3.04 — ABNORMAL LOW
RDW: 13.6
RDW: 13.8
RDW: 14
WBC: 7.1

## 2011-03-14 LAB — BASIC METABOLIC PANEL
CO2: 26
Calcium: 8.2 — ABNORMAL LOW
Calcium: 8.3 — ABNORMAL LOW
Calcium: 8.6
Creatinine, Ser: 0.95
Creatinine, Ser: 1
GFR calc Af Amer: >60
GFR calc Af Amer: >60
GFR calc non Af Amer: 58 — ABNORMAL LOW
GFR calc non Af Amer: 58 — ABNORMAL LOW
Glucose, Bld: 120 — ABNORMAL HIGH
Glucose, Bld: 123 — ABNORMAL HIGH
Glucose, Bld: 126 — ABNORMAL HIGH
Potassium: 4.6
Sodium: 137
Sodium: 139

## 2011-03-14 LAB — COMPREHENSIVE METABOLIC PANEL
ALT: 12
AST: 23
Albumin: 4.1
Calcium: 9.7
GFR calc Af Amer: >60
Sodium: 137
Total Protein: 7.1

## 2011-03-14 LAB — DIFFERENTIAL
Basophils Relative: 1
Eosinophils Relative: 2
Lymphs Abs: 1.5
Monocytes Absolute: 0.4
Neutrophils Relative %: 50

## 2011-03-14 LAB — URINE CULTURE

## 2011-03-14 LAB — URINE MICROSCOPIC-ADD ON

## 2011-03-14 LAB — CROSSMATCH
ABO/RH(D): O POS
Antibody Screen: NEGATIVE

## 2011-03-14 LAB — PROTIME-INR: INR: 0.9

## 2011-08-29 ENCOUNTER — Other Ambulatory Visit: Payer: Self-pay | Admitting: Nephrology

## 2011-08-31 ENCOUNTER — Ambulatory Visit
Admission: RE | Admit: 2011-08-31 | Discharge: 2011-08-31 | Disposition: A | Discharge status: Home / Self Care | Payer: Medicare Other | Source: Ambulatory Visit | Attending: Nephrology | Admitting: Nephrology

## 2011-12-20 ENCOUNTER — Other Ambulatory Visit: Payer: Self-pay | Admitting: Internal Medicine

## 2011-12-20 DIAGNOSIS — R443 Hallucinations, unspecified: Secondary | ICD-10-CM

## 2012-11-07 ENCOUNTER — Other Ambulatory Visit: Payer: Self-pay | Admitting: Internal Medicine

## 2012-11-07 DIAGNOSIS — E049 Nontoxic goiter, unspecified: Secondary | ICD-10-CM

## 2012-11-09 ENCOUNTER — Ambulatory Visit (HOSPITAL_COMMUNITY)
Admission: RE | Admit: 2012-11-09 | Discharge: 2012-11-09 | Disposition: A | Discharge status: Home / Self Care | Payer: Medicare Other | Source: Ambulatory Visit | Attending: Internal Medicine | Admitting: Internal Medicine

## 2012-11-09 DIAGNOSIS — E042 Nontoxic multinodular goiter: Secondary | ICD-10-CM | POA: Insufficient documentation

## 2012-11-09 DIAGNOSIS — E049 Nontoxic goiter, unspecified: Secondary | ICD-10-CM

## 2012-11-29 ENCOUNTER — Other Ambulatory Visit: Payer: Self-pay | Admitting: Internal Medicine

## 2012-11-29 DIAGNOSIS — E041 Nontoxic single thyroid nodule: Secondary | ICD-10-CM

## 2012-12-16 ENCOUNTER — Encounter: Payer: Self-pay | Admitting: *Deleted

## 2012-12-17 ENCOUNTER — Encounter: Payer: Self-pay | Admitting: Cardiovascular Disease

## 2012-12-25 ENCOUNTER — Inpatient Hospital Stay
Admission: RE | Admit: 2012-12-25 | Discharge: 2012-12-25 | Disposition: A | Discharge status: Home / Self Care | Payer: Medicare Other | Source: Ambulatory Visit | Attending: Internal Medicine | Admitting: Internal Medicine

## 2012-12-31 ENCOUNTER — Ambulatory Visit (INDEPENDENT_AMBULATORY_CARE_PROVIDER_SITE_OTHER): Payer: Medicare Other | Admitting: Cardiovascular Disease

## 2012-12-31 ENCOUNTER — Encounter: Payer: Self-pay | Admitting: Cardiovascular Disease

## 2012-12-31 VITALS — BP 120/62 | HR 48 | Resp 20 | Ht 59.0 in | Wt 219.0 lb

## 2012-12-31 DIAGNOSIS — H409 Unspecified glaucoma: Secondary | ICD-10-CM

## 2012-12-31 DIAGNOSIS — I10 Essential (primary) hypertension: Secondary | ICD-10-CM

## 2012-12-31 DIAGNOSIS — R001 Bradycardia, unspecified: Secondary | ICD-10-CM

## 2012-12-31 DIAGNOSIS — I498 Other specified cardiac arrhythmias: Secondary | ICD-10-CM

## 2012-12-31 DIAGNOSIS — E785 Hyperlipidemia, unspecified: Secondary | ICD-10-CM

## 2012-12-31 NOTE — Assessment & Plan Note (Signed)
Weight loss is indeed very important. We discussed various methods to achieve this. I think a low carbohydrate diet with high-protein and high saturated fat as the best approach for her. She has a weakness for sweets which she will have to avoid. She likes eating fruits, we reviewed a lower carbohydrate options such as berries and melons and rather than apples and bananas.

## 2012-12-31 NOTE — Assessment & Plan Note (Signed)
Avoid beta blockers and alpha 2 receptor agonist that may worsen bradycardia

## 2012-12-31 NOTE — Patient Instructions (Signed)
Your physician recommends that you schedule a follow-up appointment in: 1 year. Keep up your efforts at losing weight and begin exercising. Please call us if you develop dizziness, lose consciousness or have worsening fatigue.

## 2012-12-31 NOTE — Assessment & Plan Note (Signed)
This is slowly becoming more severe but remains completely asymptomatic. At this point in time pacemaker therapy still does not appear necessary. She is encouraged to call us immediately if she develops symptomatic event such as dizziness, syncope or severe fatigue. We briefly discussed the pros and cons of pacemaker therapy in its technical details.

## 2012-12-31 NOTE — Assessment & Plan Note (Signed)
Her most recent lipid profile showed favorable values on the current regimen

## 2012-12-31 NOTE — Progress Notes (Signed)
Patient ID: Alexandra Dixon, female   DOB: [birth year], 77 y.o.   MRN: [id number]    Reason for office visit Bradycardia  Alexandra Dixon is now 77 years old and remains active. She lives in a three-story home and does not feel in any way limited when she walks up and down the stairs several times a day. Having said that she does not engage in any type of sustained physical activity for health benefits. She denies any dizziness lightheadedness or syncope or fatigue despite the fact that she has persistent marked bradycardia. She is well treated hyperlipidemia and systemic hypertension. She is morbidly obese and has not made any significant inroads with weight loss. She knows that she has to change her diet and engage in physical activity but has had difficulty implementing both. Although she avoids starchy foods she has a weakness for sweets. She does not exercise at all.    No Known Allergies  Current Outpatient Prescriptions  Medication Sig Dispense Refill  . aspirin 325 MG tablet Take 325 mg by mouth daily.      . bimatoprost (LUMIGAN) 0.03 % ophthalmic solution Place 1 drop into both eyes at bedtime.      . COSOPT PF 22.3-6.8 MG/ML SOLN Place 1 drop into both eyes 2 (two) times daily.      . dorzolamide (TRUSOPT) 2 % ophthalmic solution Place 1 drop into both eyes 2 (two) times daily.      Marland Kitchen levothyroxine (SYNTHROID, LEVOTHROID) 50 MCG tablet Take 50 mcg by mouth daily before breakfast. Except Wednesdays and Sundays.      Marland Kitchen lisinopril-hydrochlorothiazide (PRINZIDE,ZESTORETIC) 20-25 MG per tablet Take 1 tablet by mouth daily.      . Multiple Vitamin (MULTIVITAMIN) tablet Take 1 tablet by mouth daily.      Marland Kitchen oxybutynin (DITROPAN-XL) 10 MG 24 hr tablet Take 10 mg by mouth daily.      . rosuvastatin (CRESTOR) 20 MG tablet Take 20 mg by mouth daily.      . TRAVATAN Z 0.004 % SOLN ophthalmic solution Place 1 drop into both eyes at bedtime.       No current facility-administered medications for this  visit.    Past Medical History  Diagnosis Date  . Systemic hypertension   . Hyperlipemia   . Hypothyroidism   . Glaucoma   . Obesity   . Heart murmur   . Sinus bradycardia     Past Surgical History  Procedure Laterality Date  . Replacement total knee  08/21/2006    left knee  . US echocardiography  06/25/2007    mild LVH,LA mildly dilated,mild to mod mitral annular ca+,mild MR,mild to mod TR,AOV mildly sclerotic,aortic root sclerosis  . Nm myoview ltd  07/27/2006    low risk, cannot exclude subtle anteroapical ischemia    Family History  Problem Relation Age of Onset  . Heart failure Mother   . Heart failure Father   . Diabetes Father   . Kidney disease Mother     History   Social History  . Marital Status: Married    Spouse Name: N/A    Number of Children: N/A  . Years of Education: N/A   Occupational History  . Not on file.   Social History Main Topics  . Smoking status: Never Smoker   . Smokeless tobacco: Never Used  . Alcohol Use: No  . Drug Use: No  . Sexually Active: Not on file   Other Topics Concern  . Not on file  Social History Narrative  . No narrative on file    Review of systems: The patient specifically denies any chest pain at rest or with exertion, dyspnea at rest or with exertion, orthopnea, paroxysmal nocturnal dyspnea, syncope, palpitations, focal neurological deficits, intermittent claudication, lower extremity edema, unexplained weight gain, cough, hemoptysis or wheezing.  The patient also denies abdominal pain, nausea, vomiting, dysphagia, diarrhea, constipation, polyuria, polydipsia, dysuria, hematuria, frequency, urgency, abnormal bleeding or bruising, fever, chills, unexpected weight changes, mood swings, change in skin or hair texture, change in voice quality, auditory or visual problems, allergic reactions or rashes, new musculoskeletal complaints other than usual "aches and pains".   PHYSICAL EXAM BP 120/62  Pulse 48  Resp 20   Ht 4\' 11"  (1.499 m)  Wt 219 lb (99.338 kg)  BMI 44.21 kg/m2  General: Alert, oriented x3, no distress Head: no evidence of trauma, PERRL, EOMI, no exophtalmos or lid lag, no myxedema, no xanthelasma; normal ears, nose and oropharynx Neck: normal jugular venous pulsations and no hepatojugular reflux; brisk carotid pulses without delay and no carotid bruits Chest: clear to auscultation, no signs of consolidation by percussion or palpation, normal fremitus, symmetrical and full respiratory excursions Cardiovascular: normal position and quality of the apical impulse, regular rhythm, normal first and second heart sounds, early peaking short grade 2/6 systolic ejection murmur in the aortic focus, rubs or gallops Abdomen: no tenderness or distention, no masses by palpation, no abnormal pulsatility or arterial bruits, normal bowel sounds, no hepatosplenomegaly Extremities: no clubbing, cyanosis or edema; 2+ radial, ulnar and brachial pulses bilaterally; 2+ right femoral, posterior tibial and dorsalis pedis pulses; 2+ left femoral, posterior tibial and dorsalis pedis pulses; no subclavian or femoral bruits Neurological: grossly nonfocal   EKG: Sinus bradycardia, first degree AV block, otherwise normal  ASSESSMENT AND PLAN Bradycardia This is slowly becoming more severe but remains completely asymptomatic. At this point in time pacemaker therapy still does not appear necessary. She is encouraged to call us immediately if she develops symptomatic event such as dizziness, syncope or severe fatigue. We briefly discussed the pros and cons of pacemaker therapy in its technical details.  Morbid obesity Weight loss is indeed very important. We discussed various methods to achieve this. I think a low carbohydrate diet with high-protein and high saturated fat as the best approach for her. She has a weakness for sweets which she will have to avoid. She likes eating fruits, we reviewed a lower carbohydrate options  such as berries and melons and rather than apples and bananas.  Hyperlipidemia Her most recent lipid profile showed favorable values on the current regimen  Essential hypertension Avoid beta blockers and alpha 2 receptor agonist that may worsen bradycardia  Glaucoma Avoid beta blockers and alpha 2 receptor agonist that may worsen bradycardia  Orders Placed This Encounter  Procedures  . EKG 12-Lead   Meds ordered this encounter  Medications  . COSOPT PF 22.3-6.8 MG/ML SOLN    Sig: Place 1 drop into both eyes 2 (two) times daily.  Marland Kitchen oxybutynin (DITROPAN-XL) 10 MG 24 hr tablet    Sig: Take 10 mg by mouth daily.  . TRAVATAN Z 0.004 % SOLN ophthalmic solution    Sig: Place 1 drop into both eyes at bedtime.    Junious Silk, MD, [facility] [phone] office [phone] pager

## 2012-12-31 NOTE — Assessment & Plan Note (Signed)
Avoid beta blockers and alpha 2 receptor agonist that may worsen bradycardia 

## 2013-01-03 ENCOUNTER — Ambulatory Visit
Admission: RE | Admit: 2013-01-03 | Discharge: 2013-01-03 | Disposition: A | Discharge status: Home / Self Care | Payer: Medicare Other | Source: Ambulatory Visit | Attending: Internal Medicine | Admitting: Internal Medicine

## 2013-01-03 ENCOUNTER — Other Ambulatory Visit (HOSPITAL_COMMUNITY)
Admission: RE | Admit: 2013-01-03 | Discharge: 2013-01-03 | Disposition: A | Discharge status: Home / Self Care | Payer: Medicare Other | Source: Ambulatory Visit | Attending: Interventional Radiology | Admitting: Interventional Radiology

## 2013-01-03 DIAGNOSIS — D449 Neoplasm of uncertain behavior of unspecified endocrine gland: Secondary | ICD-10-CM | POA: Insufficient documentation

## 2013-01-03 DIAGNOSIS — E041 Nontoxic single thyroid nodule: Secondary | ICD-10-CM

## 2014-02-19 ENCOUNTER — Ambulatory Visit: Payer: Medicare Other | Admitting: Cardiovascular Disease

## 2014-03-01 IMAGING — US US RENAL
1 series · 14 of 25 positions shown · non-contrast
Comparison: Ultrasound abdomen 02/10/2010.

CLINICAL DATA: Stage III kidney disease.

RENAL/URINARY TRACT ULTRASOUND COMPLETE

[Series 1: us renal · 0.23mm/px · 14 of 27 slices shown]
[im 1/27]
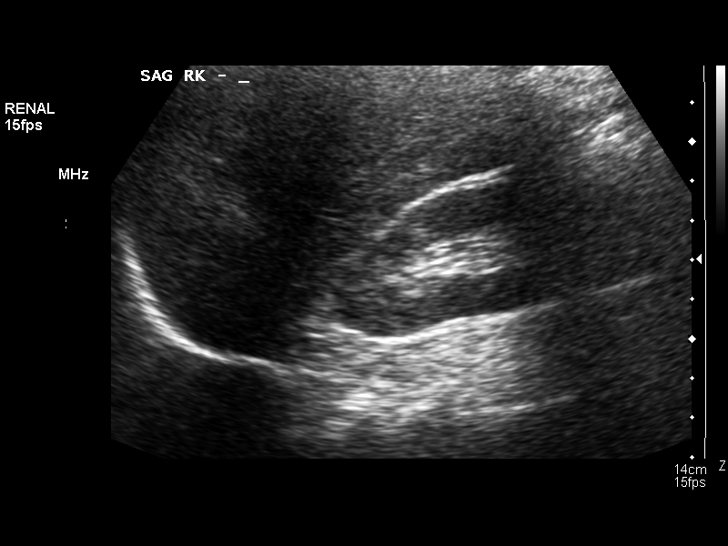
[im 3/27]
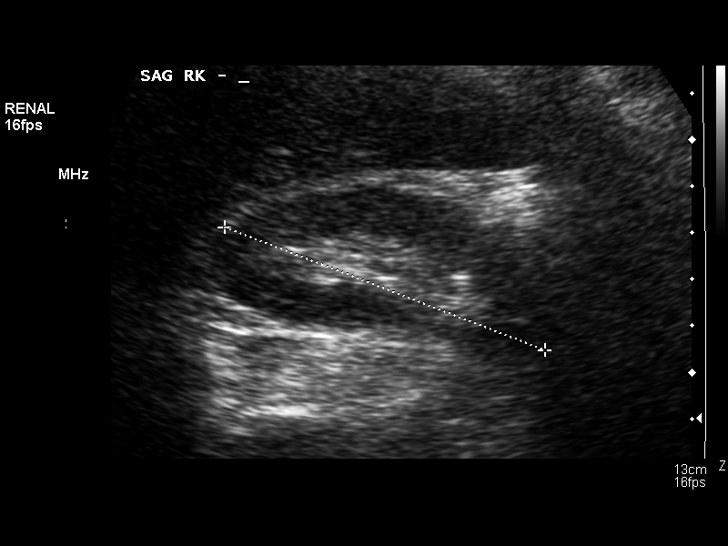
[im 5/27]
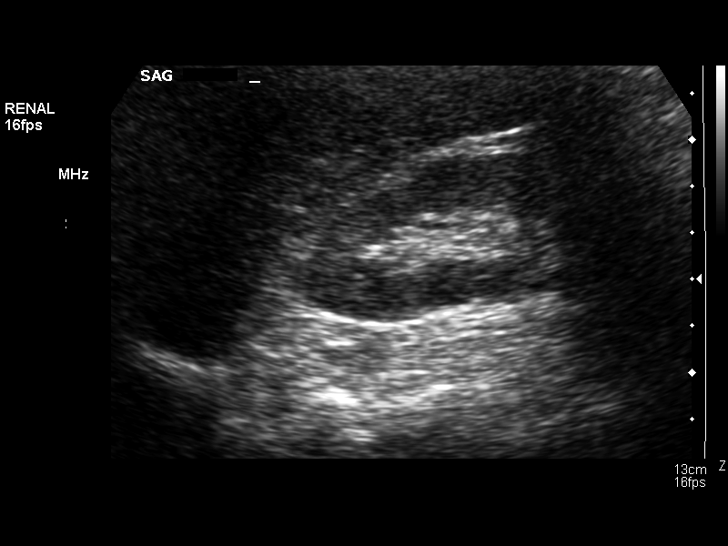
[im 7/27]
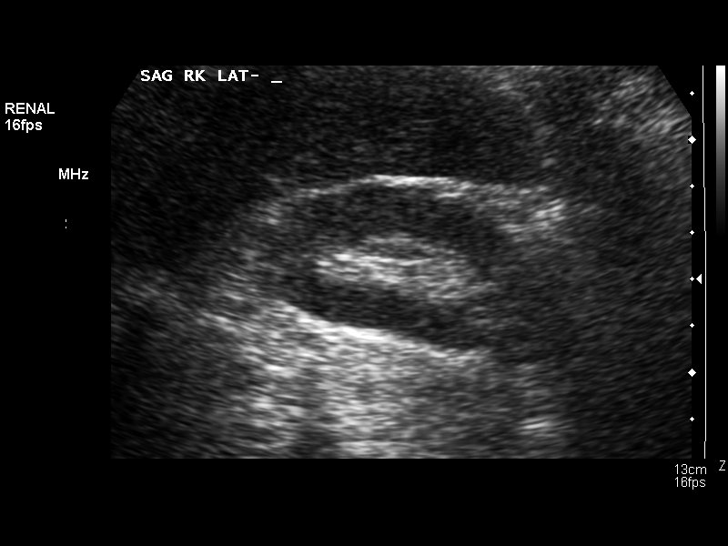
[im 9/27]
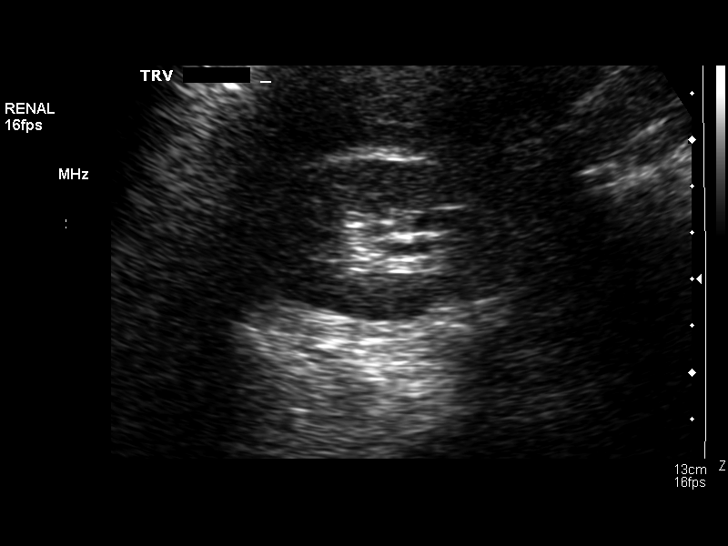
[im 10/27]
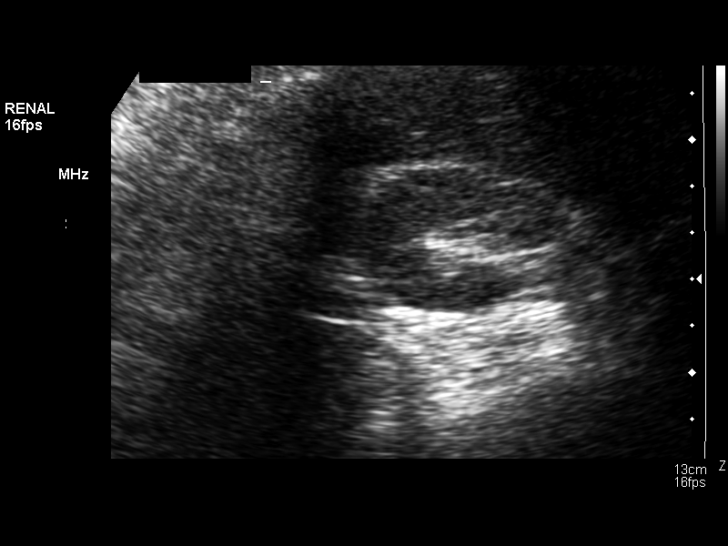
[im 12/27]
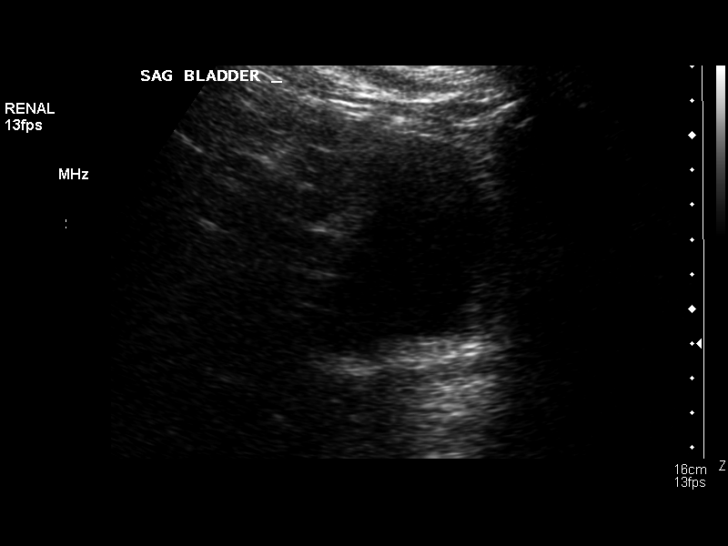
[im 15/27]
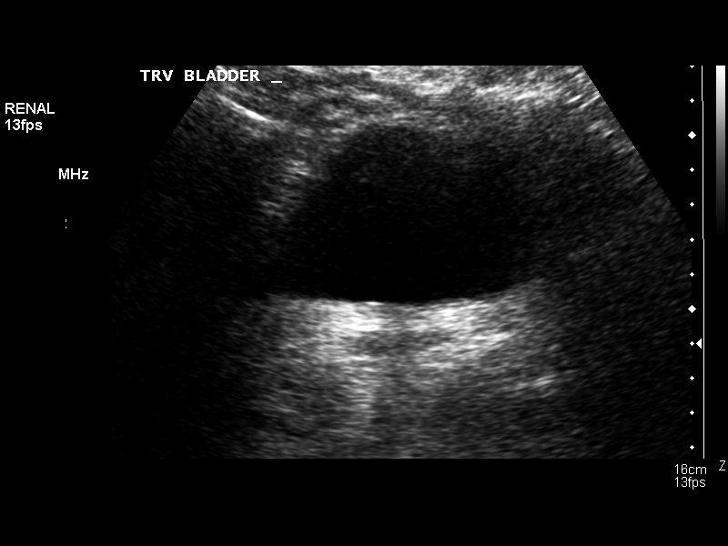
[im 17/27]
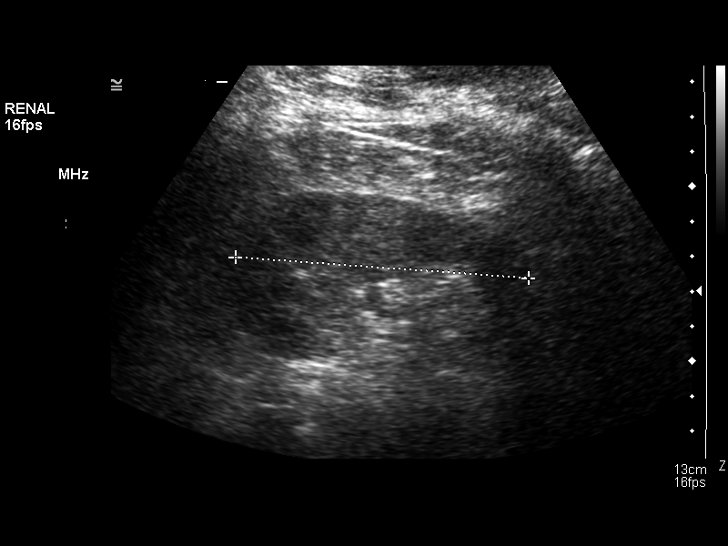
[im 18/27]
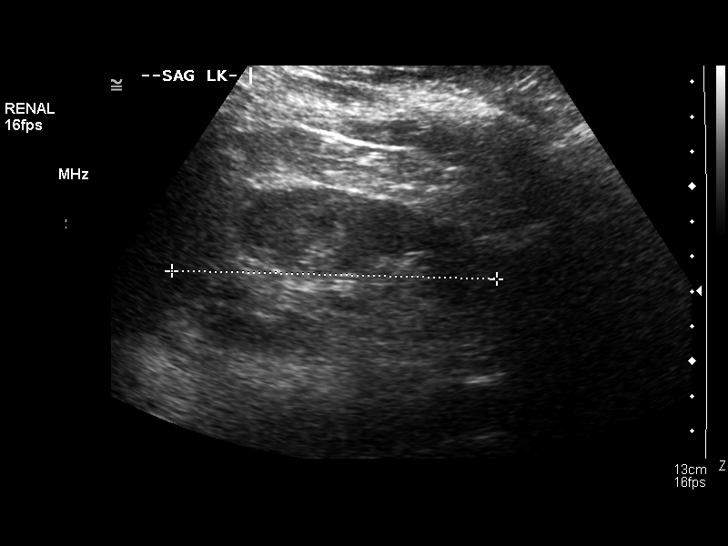
[im 20/27]
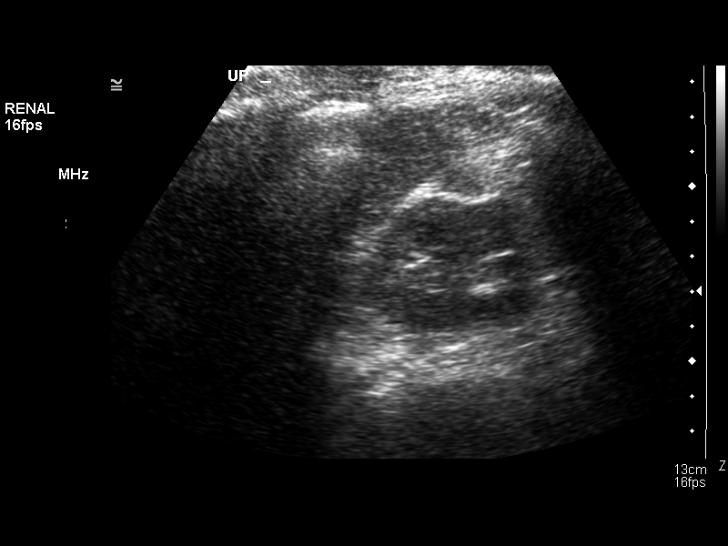
[im 22/27]
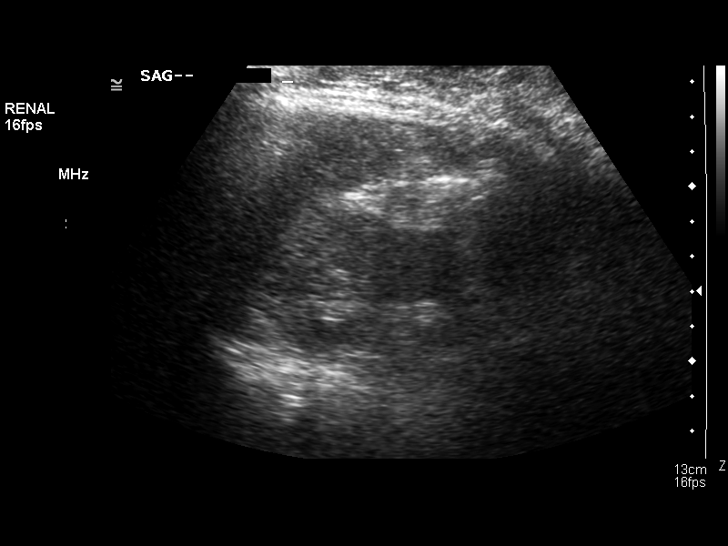
[im 24/27]
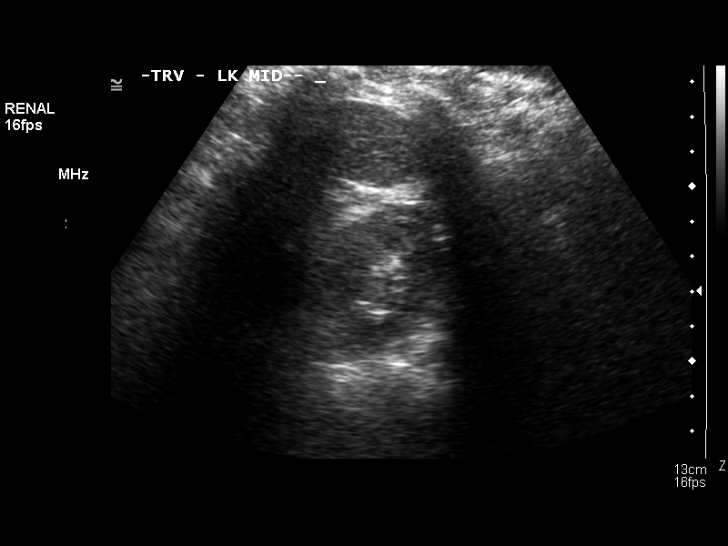
[im 27/27]
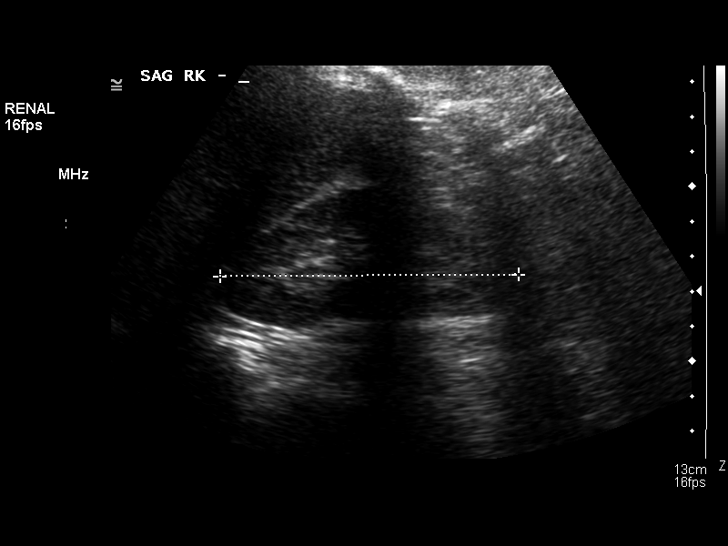

[14 of 25 positions shown; findings below may reference images not displayed]

FINDINGS: Right Kidney:  7.4 cm in length. Normal renal cortical thickness
and echogenicity without focal lesions or hydronephrosis.

Left Kidney:  8.4 cm in length. Normal renal cortical thickness and
echogenicity without focal lesions or hydronephrosis.

Bladder:  Normal.
IMPRESSION: Small kidneys but normal echogenicity and normal renal cortical
thickness without hydronephrosis.

## 2014-03-19 ENCOUNTER — Encounter: Payer: Self-pay | Admitting: Cardiovascular Disease

## 2014-03-19 ENCOUNTER — Ambulatory Visit (INDEPENDENT_AMBULATORY_CARE_PROVIDER_SITE_OTHER): Payer: Medicare Other | Admitting: Cardiovascular Disease

## 2014-03-19 VITALS — BP 142/68 | HR 54 | Resp 16 | Ht 59.0 in | Wt 219.0 lb

## 2014-03-19 DIAGNOSIS — E785 Hyperlipidemia, unspecified: Secondary | ICD-10-CM

## 2014-03-19 DIAGNOSIS — I498 Other specified cardiac arrhythmias: Secondary | ICD-10-CM

## 2014-03-19 DIAGNOSIS — E782 Mixed hyperlipidemia: Secondary | ICD-10-CM

## 2014-03-19 DIAGNOSIS — R001 Bradycardia, unspecified: Secondary | ICD-10-CM

## 2014-03-19 DIAGNOSIS — R0989 Other specified symptoms and signs involving the circulatory and respiratory systems: Secondary | ICD-10-CM

## 2014-03-19 DIAGNOSIS — I10 Essential (primary) hypertension: Secondary | ICD-10-CM

## 2014-03-19 DIAGNOSIS — Z79899 Other long term (current) drug therapy: Secondary | ICD-10-CM

## 2014-03-19 NOTE — Progress Notes (Signed)
Patient ID: Alexandra Dixon, female   DOB: 11-30-1935, 78 y.o.   MRN: [id number]      Reason for office visit Bradycardia, hypertension, hyperlipidemia  Alexandra Dixon is a 78 year old woman with a history of asymptomatic but marked sinus bradycardia who returns for routine followup. Chest not had syncope or fatigue. She denies shortness of breath on exertion. She continues to live in a three-story home, and has little trouble taking care of her household. She remains morbidly obese. She has treated hypertension, hypothyroidism and hyperlipidemia. Not have a history of stroke/TIA or any focal neurological complaints. She has a long-standing history of a cardiac murmur and previous echocardiography has not shown evidence of serious valvular abnormalities (mild mitral regurgitation, aortic valve sclerosis, mild to moderate tricuspid insufficiency). She does not have a known coronary artery disease. A nuclear stress test in 2000 and showed a questionable apical defect felt to be most likely related to breast attenuation artifact. She has mild renal insufficiency and follows up on a regular basis with Dr. Posey Pronto. Her metabolic abnormalities are treated and followed by Dr. Criss Rosales.   No Known Allergies  Current Outpatient Prescriptions  Medication Sig Dispense Refill  . aspirin 325 MG tablet Take 325 mg by mouth daily.      . Iron-FA-B Cmp-C-Biot-Probiotic (FUSION PLUS) CAPS       . levothyroxine (SYNTHROID, LEVOTHROID) 50 MCG tablet Take 50 mcg by mouth daily before breakfast. Except Wednesdays and Sundays.      Marland Kitchen lisinopril-hydrochlorothiazide (PRINZIDE,ZESTORETIC) 20-25 MG per tablet Take 1 tablet by mouth daily.      . Multiple Vitamin (MULTIVITAMIN) tablet Take 1 tablet by mouth daily.      Marland Kitchen oxybutynin (DITROPAN-XL) 10 MG 24 hr tablet Take 10 mg by mouth daily.      . rosuvastatin (CRESTOR) 20 MG tablet Take 20 mg by mouth daily.      . TRAVATAN Z 0.004 % SOLN ophthalmic solution Place 1 drop into  both eyes at bedtime.       No current facility-administered medications for this visit.    Past Medical History  Diagnosis Date  . Systemic hypertension   . Hyperlipemia   . Hypothyroidism   . Glaucoma   . Obesity   . Heart murmur   . Sinus bradycardia     Past Surgical History  Procedure Laterality Date  . Replacement total knee  08/21/2006    left knee and right knee  . US echocardiography  06/25/2007    mild LVH,LA mildly dilated,mild to mod mitral annular ca+,mild MR,mild to mod TR,AOV mildly sclerotic,aortic root sclerosis  . Nm myoview ltd  07/27/2006    low risk, cannot exclude subtle anteroapical ischemia    Family History  Problem Relation Age of Onset  . Heart failure Mother   . Heart failure Father   . Diabetes Father   . Kidney disease Mother     History   Social History  . Marital Status: Married    Spouse Name: N/A    Number of Children: N/A  . Years of Education: N/A   Occupational History  . Not on file.   Social History Main Topics  . Smoking status: Never Smoker   . Smokeless tobacco: Never Used  . Alcohol Use: No  . Drug Use: No  . Sexual Activity: Not on file   Other Topics Concern  . Not on file   Social History Narrative  . No narrative on file    Review of  systems: The patient specifically denies any chest pain at rest or with exertion, dyspnea at rest or with exertion, orthopnea, paroxysmal nocturnal dyspnea, syncope, palpitations, focal neurological deficits, intermittent claudication, lower extremity edema, unexplained weight gain, cough, hemoptysis or wheezing.  The patient also denies abdominal pain, nausea, vomiting, dysphagia, diarrhea, constipation, polyuria, polydipsia, dysuria, hematuria, frequency, urgency, abnormal bleeding or bruising, fever, chills, unexpected weight changes, mood swings, change in skin or hair texture, change in voice quality, auditory or visual problems, allergic reactions or rashes, new musculoskeletal  complaints other than usual "aches and pains".   PHYSICAL EXAM BP 142/68  Pulse 54  Resp 16  Ht 4\' 11"  (1.499 m)  Wt 99.338 kg (219 lb)  BMI 44.21 kg/m2  General: Alert, oriented x3, no distress Head: no evidence of trauma, PERRL, EOMI, no exophtalmos or lid lag, no myxedema, no xanthelasma; normal ears, nose and oropharynx Neck: normal jugular venous pulsations and no hepatojugular reflux; brisk carotid pulses without delay but there is a new distinct left carotid bruit Chest: clear to auscultation, no signs of consolidation by percussion or palpation, normal fremitus, symmetrical and full respiratory excursions Cardiovascular: normal position and quality of the apical impulse, regular rhythm, normal first and second heart sounds, no rubs or gallops, is a grade 2/6 early peaking systolic ejection murmur in the aortic focus unchanged since 2011. Abdomen: no tenderness or distention, no masses by palpation, no abnormal pulsatility or arterial bruits, normal bowel sounds, no hepatosplenomegaly Extremities: no clubbing, cyanosis or edema; 2+ radial, ulnar and brachial pulses bilaterally; 2+ right femoral, posterior tibial and dorsalis pedis pulses; 2+ left femoral, posterior tibial and dorsalis pedis pulses; no subclavian or femoral bruits Neurological: grossly nonfocal   EKG: Sinus bradycardia, otherwise normal  ASSESSMENT AND PLAN  Alexandra Dixon continues to have mild sinus bradycardia that is asymptomatic and does not require specific therapy. Pacemaker is not indicated. Avoid all negative chronotropic agents including beta blockers and glaucoma eye drops that contain either beta blockers or brimonidine (these have given her bradycardia in the past).  She has a new left carotid bruit and I recommended that she have this evaluated with ultrasonography.  She is due to have a lipid profile and routine labs since she takes an ACE inhibitor, diuretic and statin. The importance of weight  loss/regular exercise was again reviewed.  We'll call her with the results of these studies, but otherwise she'll followup on a yearly basis, sooner should she develop neurological complaints or symptoms her bradycardia.  Orders Placed This Encounter  Procedures  . Lipid panel  . Comprehensive metabolic panel  . EKG 12-Lead  . Doppler carotid   Meds ordered this encounter  Medications  . Iron-FA-B Cmp-C-Biot-Probiotic (FUSION PLUS) CAPS    Sig:     Holli Humbles, MD, [facility] [phone] office [phone] pager

## 2014-03-19 NOTE — Patient Instructions (Signed)
Your physician has requested that you have a carotid duplex. This test is an ultrasound of the carotid arteries in your neck. It looks at blood flow through these arteries that supply the brain with blood. Allow one hour for this exam. There are no restrictions or special instructions.  Your physician recommends that you return for lab work in: You can have this lab work done through your Dr Perley Jain office or at [employer].  Dr. Sallyanne Kuster recommends that you schedule a follow-up appointment in: One year.

## 2014-03-27 ENCOUNTER — Ambulatory Visit (HOSPITAL_COMMUNITY)
Admission: RE | Admit: 2014-03-27 | Discharge: 2014-03-27 | Disposition: A | Discharge status: Home / Self Care | Payer: Medicare Other | Source: Ambulatory Visit | Attending: Cardiology | Admitting: Cardiology

## 2014-03-27 DIAGNOSIS — R0989 Other specified symptoms and signs involving the circulatory and respiratory systems: Secondary | ICD-10-CM | POA: Insufficient documentation

## 2014-03-27 NOTE — Progress Notes (Signed)
Carotid Duplex Completed. Sherran Margolis, BS, RDMS, RVT  

## 2015-05-11 IMAGING — US US SOFT TISSUE HEAD/NECK
1 series · 13 of 25 positions shown · non-contrast
Comparison: No comparison studies available.

***ADDENDUM*** CREATED: 11/30/2012 [DATE]

The following is an addendum to the above reported.
Images 42 through 53 are incorrectly labeled "right" and should be
labeled "left."
CLINICAL DATA: None toxic goiter
THYROID ULTRASOUND
TECHNIQUE: Ultrasound examination of the thyroid gland and
adjacent soft tissues was performed.

[Series 1: us soft tissue head/neck · 0.07mm/px · 13 of 60 slices shown]
[im 1/60]
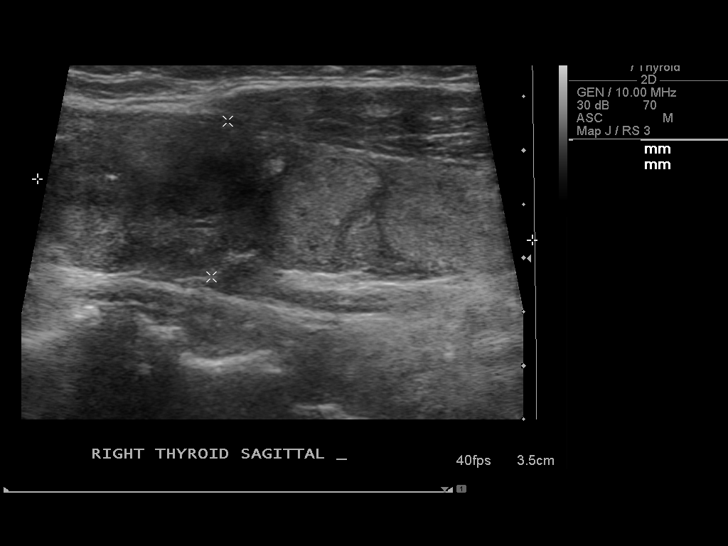
[im 5/60]
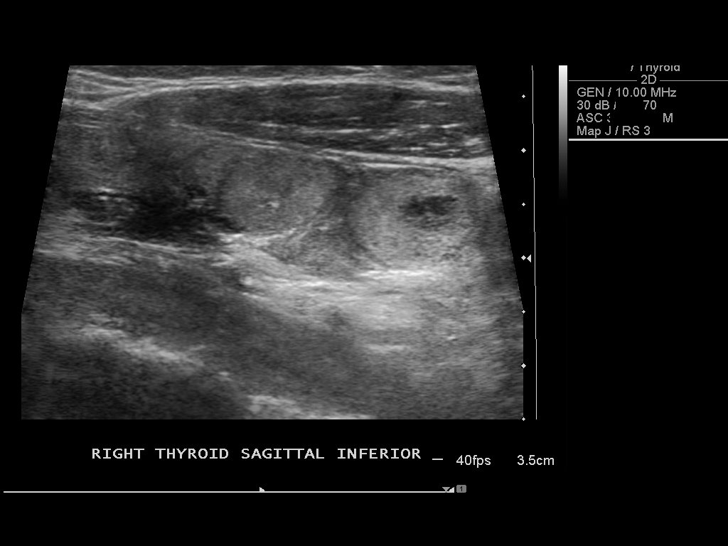
[im 10/60]
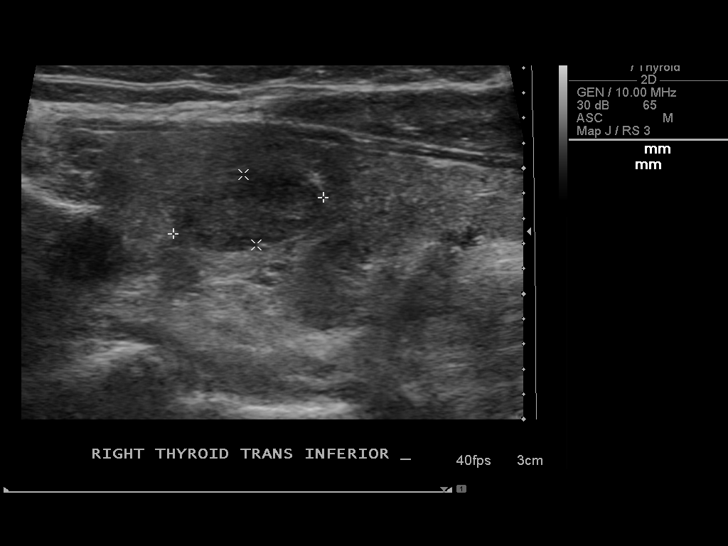
[im 15/60]
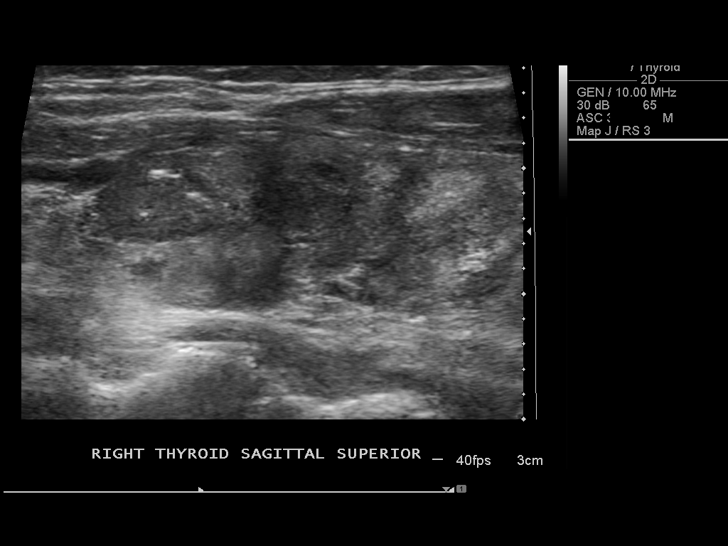
[im 20/60]
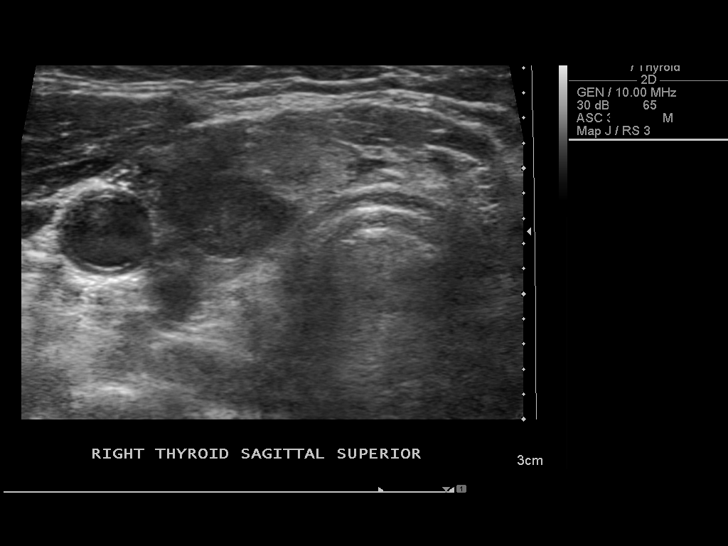
[im 25/60]
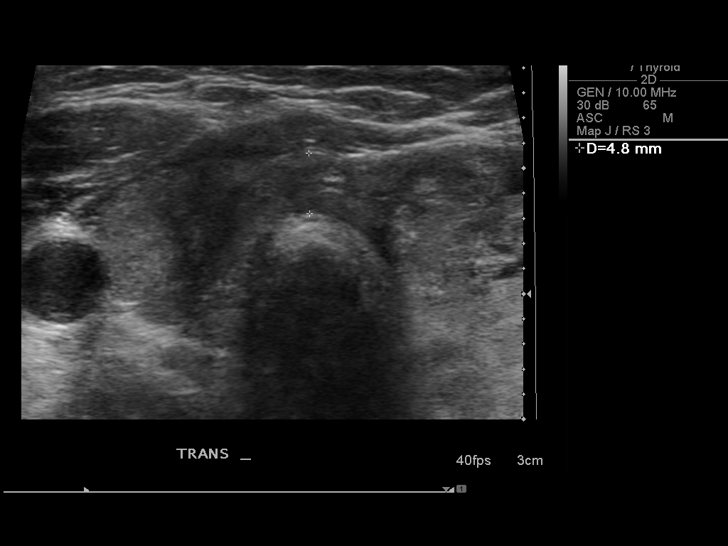
[im 30/60]
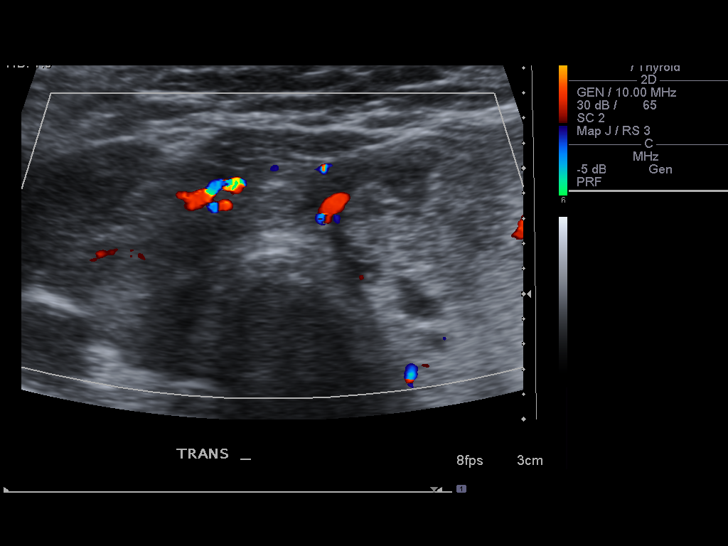
[im 35/60]
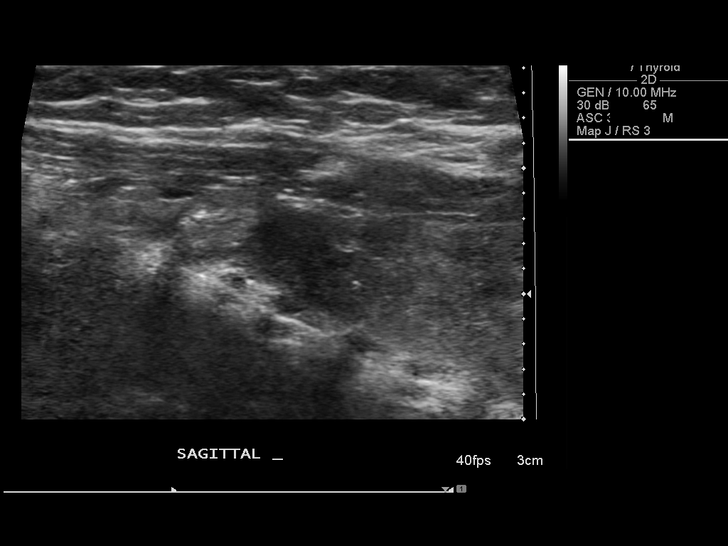
[im 40/60]
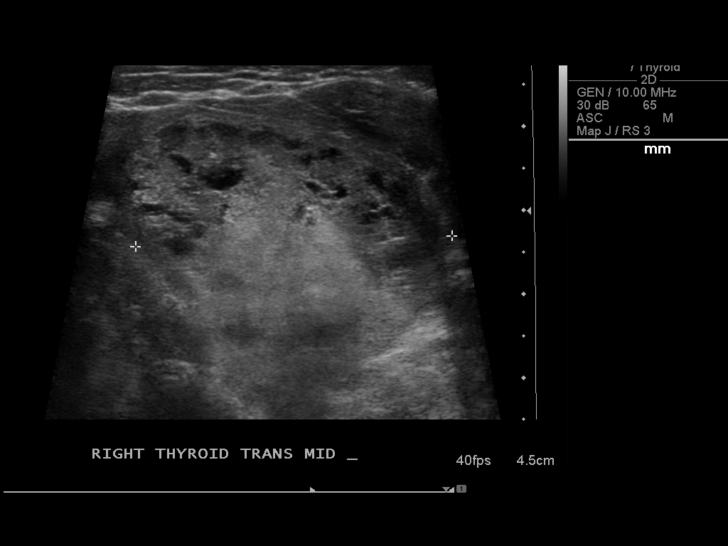
[im 45/60]
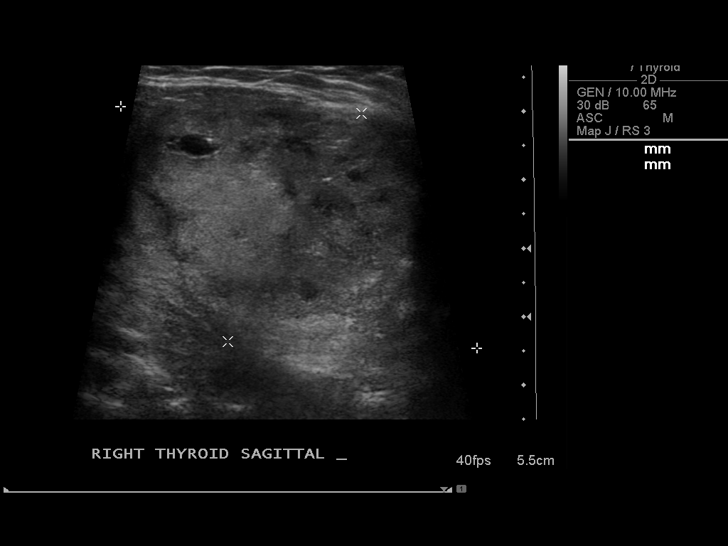
[im 50/60]
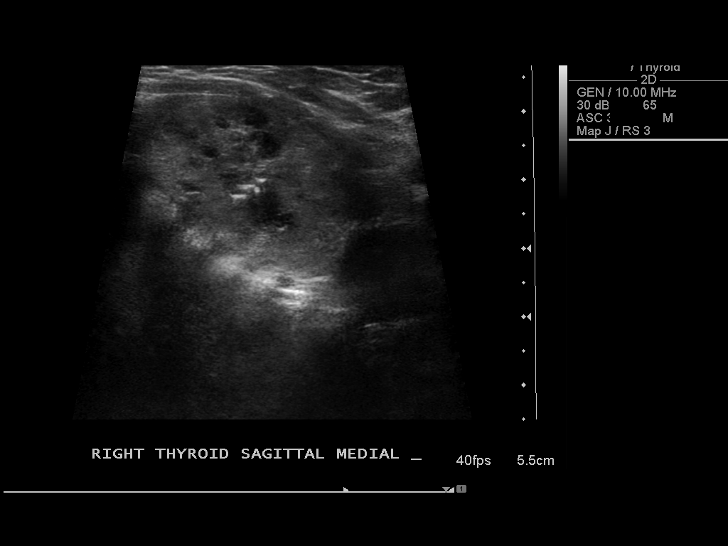
[im 55/60]
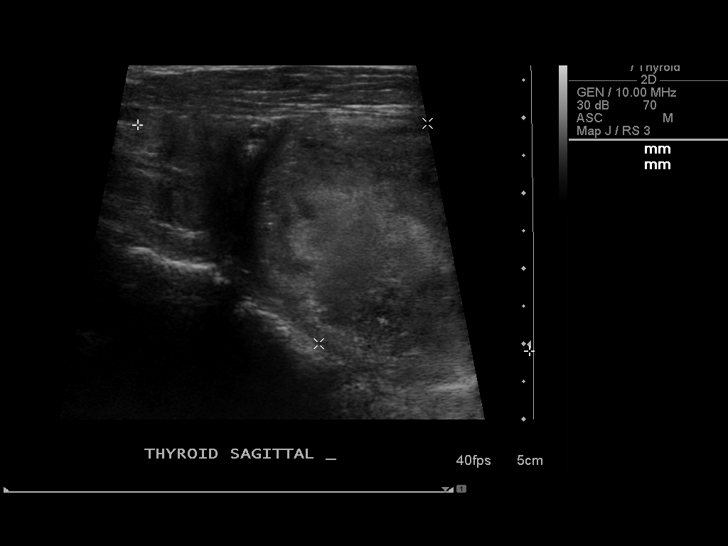
[im 60/60]
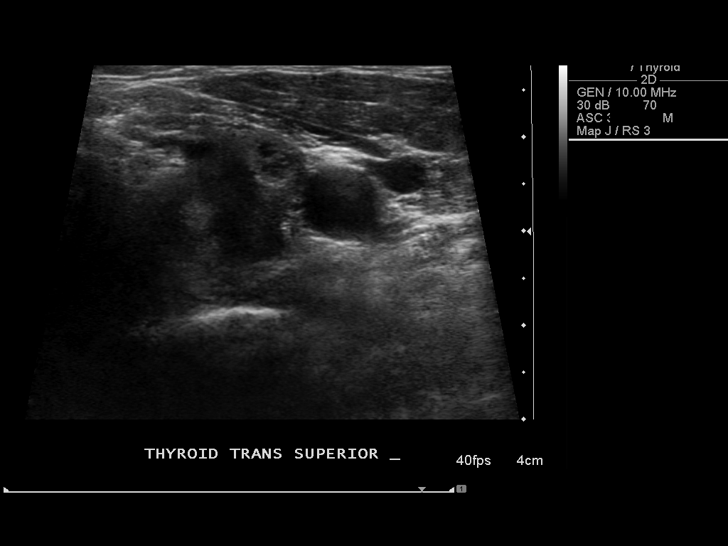

[13 of 25 positions shown; findings below may reference images not displayed]

FINDINGS: The right thyroid lobe measures 4.6 x 1.5 x 1.5 cm.  The left lobe
measures 6.3 x 3.9 x 3.8 cm.  The isthmus is 5 mm in thickness.

Multiple thyroid nodules are seen bilaterally.  Multiple right-
sided nodules are identified measuring in the 1.1-1.3 cm range for
maximum diameter.  In the upper pole, there is a nodule that has
some associated coarse dystrophic calcification.

Left thyroid lobe is more substantially enlarged and is markedly
heterogeneous throughout.  There appears to be some scattered areas
of microcalcification.
IMPRESSION: Asymmetric enlargement the left thyroid lobe although the thyroid
gland is diffusely of multinodular and heterogeneous.  Imaging
features may be related to a multinodular goiter, but neoplasm
cannot be excluded.  Dominant abnormality is on the left and tissue
sampling is recommended..

## 2015-07-22 ENCOUNTER — Ambulatory Visit (INDEPENDENT_AMBULATORY_CARE_PROVIDER_SITE_OTHER): Payer: Medicare Other | Admitting: Cardiovascular Disease

## 2015-07-22 ENCOUNTER — Encounter: Payer: Self-pay | Admitting: Cardiovascular Disease

## 2015-07-22 VITALS — BP 118/60 | HR 59 | Ht 59.0 in | Wt 216.0 lb

## 2015-07-22 DIAGNOSIS — I10 Essential (primary) hypertension: Secondary | ICD-10-CM | POA: Diagnosis not present

## 2015-07-22 DIAGNOSIS — R001 Bradycardia, unspecified: Secondary | ICD-10-CM

## 2015-07-22 DIAGNOSIS — E785 Hyperlipidemia, unspecified: Secondary | ICD-10-CM | POA: Diagnosis not present

## 2015-07-22 DIAGNOSIS — I440 Atrioventricular block, first degree: Secondary | ICD-10-CM | POA: Insufficient documentation

## 2015-07-22 NOTE — Progress Notes (Signed)
Patient ID: Alexandra Dixon, female   DOB: 1935-12-13, 80 y.o.   MRN: [id number]    Cardiology Office Note    Date:  07/22/2015   ID:  Alexandra Dixon, DOB May 30, 1936, MRN [id number]  PCP:  Elyn Peers, MD  Cardiologist: Sanda Klein, MD   Chief Complaint  Patient presents with  . Follow-up    no chest pain, no shortness of breath, no swelling, no cramping, no dizzizness or lightheadedness    History of Present Illness:  Alexandra Dixon is a 80 y.o. female with morbid obesity, treated systemic hypertension (with LVH) , hypothyroidism, hyperlipidemia on statin therapy and mild to moderate tricuspid insufficiency , who also has mild renal insufficiency. She had lab tests performed recently with Dr. Posey Pronto and Dr. Criss Rosales; she was told that her kidney function is stable and that her lipid profile is favorable. I don't have those results for review.   she lives in a 3 story home and takes care of all housework without help and without complaints of dyspnea, angina, leg edema, dizziness, syncope, palpitations or focal neurological complaints. She struggles with her attempts to lose weight.   In the past she had fairly significant sinus bradycardia but today her heart rate is virtually normal at 59 bpm. It actually seems that her bradycardia was at least partly related to glaucoma eyedrops containing beta blockers and brimonidine.  Past Medical History  Diagnosis Date  . Systemic hypertension   . Hyperlipemia   . Hypothyroidism   . Glaucoma   . Obesity   . Heart murmur   . Sinus bradycardia     Past Surgical History  Procedure Laterality Date  . Replacement total knee  08/21/2006    left knee and right knee  . US echocardiography  06/25/2007    mild LVH,LA mildly dilated,mild to mod mitral annular ca+,mild MR,mild to mod TR,AOV mildly sclerotic,aortic root sclerosis  . Nm myoview ltd  07/27/2006    low risk, cannot exclude subtle anteroapical ischemia    Outpatient Prescriptions Prior to  Visit  Medication Sig Dispense Refill  . aspirin 325 MG tablet Take 325 mg by mouth daily.    . Iron-FA-B Cmp-C-Biot-Probiotic (FUSION PLUS) CAPS     . levothyroxine (SYNTHROID, LEVOTHROID) 50 MCG tablet Take 50 mcg by mouth daily before breakfast. Except Wednesdays and Sundays.    Marland Kitchen lisinopril-hydrochlorothiazide (PRINZIDE,ZESTORETIC) 20-25 MG per tablet Take 1 tablet by mouth daily.    . Multiple Vitamin (MULTIVITAMIN) tablet Take 1 tablet by mouth daily.    Marland Kitchen oxybutynin (DITROPAN-XL) 10 MG 24 hr tablet Take 10 mg by mouth daily.    . rosuvastatin (CRESTOR) 20 MG tablet Take 20 mg by mouth daily.    . TRAVATAN Z 0.004 % SOLN ophthalmic solution Place 1 drop into both eyes at bedtime.     No facility-administered medications prior to visit.     Allergies:   Review of patient's allergies indicates no known allergies.   Social History   Social History  . Marital Status: Married    Spouse Name: N/A  . Number of Children: N/A  . Years of Education: N/A   Social History Main Topics  . Smoking status: Never Smoker   . Smokeless tobacco: Never Used  . Alcohol Use: No  . Drug Use: No  . Sexual Activity: Not Asked   Other Topics Concern  . None   Social History Narrative     Family History:  The patient's family history includes Diabetes in  her father; Heart failure in her father and mother; Kidney disease in her mother.   ROS:   Please see the history of present illness.    ROS All other systems reviewed and are negative.   PHYSICAL EXAM:   VS:  BP 118/60 mmHg  Pulse 59  Ht 4\' 11"  (1.499 m)  Wt 97.977 kg (216 lb)  BMI 43.60 kg/m2   GEN: Well nourished, well developed, in no acute distress HEENT: normal Neck: no JVD, carotid bruits, or masses Cardiac: RRR; no murmurs, rubs, or gallops,no edema  Respiratory:  clear to auscultation bilaterally, normal work of breathing GI: soft, nontender, nondistended, + BS MS: no deformity or atrophy Skin: warm and dry, no  rash Neuro:  Alert and Oriented x 3, Strength and sensation are intact Psych: euthymic mood, full affect  Wt Readings from Last 3 Encounters:  07/22/15 97.977 kg (216 lb)  03/19/14 99.338 kg (219 lb)  12/31/12 99.338 kg (219 lb)      Studies/Labs Reviewed:   EKG:  EKG is ordered today.  The ekg ordered today demonstrvery mild sinus bradycardia, first-degree AV block with PR interval 220 ms, otherwise normal    ASSESSMENT:    1. Essential hypertension   2. Hyperlipidemia   3. Morbid obesity due to excess calories ([place])   4. Bradycardia      PLAN:  In order of problems listed above:  1. HTN: excellent blood pressure control; reportedly stable renal function on ACE inhibitor/thiazide diuretic combination 2. HLP: Per patient, recent favorable lipid profile 3. Obesity: We spent a long time discussing healthy changes to her diet, reducing the intake of carbohydrates and increasing the intake of protein rich food, especially with her morning meal also reviewed exercise  4. Sinus bradycardia: she has minimal bradycardia at this time but also has a mild first-degree AV block; bradycardia was pronounced in the past when she received glaucoma eyedrops with beta blockers and brimonidine. Avoid negative chronotropic agents    Medication Adjustments/Labs and Tests Ordered: Current medicines are reviewed at length with the patient today.  Concerns regarding medicines are outlined above.  Medication changes, Labs and Tests ordered today are listed in the Patient Instructions below. There are no Patient Instructions on file for this visit.     Mikael Spray, MD  07/22/2015 11:13 AM    [facility] [place] [address], [place], [place]  [address] Phone: [phone]; Fax: [phone]

## 2015-07-22 NOTE — Patient Instructions (Signed)
Dr. Croitoru recommends that you schedule a follow-up appointment in: ONE YEAR   

## 2016-07-25 ENCOUNTER — Encounter: Payer: Self-pay | Admitting: Cardiovascular Disease

## 2016-07-25 ENCOUNTER — Ambulatory Visit (INDEPENDENT_AMBULATORY_CARE_PROVIDER_SITE_OTHER): Payer: Medicare Other | Admitting: Cardiovascular Disease

## 2016-07-25 VITALS — BP 120/70 | HR 59 | Ht 59.0 in | Wt 219.4 lb

## 2016-07-25 DIAGNOSIS — I10 Essential (primary) hypertension: Secondary | ICD-10-CM | POA: Diagnosis not present

## 2016-07-25 DIAGNOSIS — I440 Atrioventricular block, first degree: Secondary | ICD-10-CM | POA: Diagnosis not present

## 2016-07-25 DIAGNOSIS — E7800 Pure hypercholesterolemia, unspecified: Secondary | ICD-10-CM | POA: Diagnosis not present

## 2016-07-25 DIAGNOSIS — R001 Bradycardia, unspecified: Secondary | ICD-10-CM

## 2016-07-25 NOTE — Progress Notes (Signed)
Patient ID: Alexandra Dixon, female   DOB: 1935/11/23, 81 y.o.   MRN: [id number]    Cardiology Office Note    Date:  07/25/2016   ID:  Alexandra Dixon, DOB [birth year], MRN [id number]  PCP:  Elyn Peers, MD  Cardiologist: Sanda Klein, MD   Chief Complaint  Patient presents with  . Follow-up    History of Present Illness:  Alexandra Dixon is a 81 y.o. female with morbid obesity, treated systemic hypertension (with LVH) , hypothyroidism, hyperlipidemia on statin therapy and mild to moderate tricuspid insufficiency , who also has mild renal insufficiency.   He has generally done well since her last appointment. Her biggest frustration is the fact that she cannot lose weight. She is still able to take care of her three-story home although she does have a housekeeper periodically that helps. She denies angina, palpitations, edema, focal neurological complaints are intermittent claudication.  Tells me that "all her labs were normal" when she recently had them checked with her primary care provider.  In the past she had fairly significant sinus bradycardia and there was brief discussion around the possible pacemaker, but today her heart rate is virtually normal at 59 bpm. It actually seems that her bradycardia was at least partly related to glaucoma eyedrops containing beta blockers .  Past Medical History:  Diagnosis Date  . Glaucoma   . Heart murmur   . Hyperlipemia   . Hypothyroidism   . Obesity   . Sinus bradycardia   . Systemic hypertension     Past Surgical History:  Procedure Laterality Date  . NM MYOVIEW LTD  07/27/2006   low risk, cannot exclude subtle anteroapical ischemia  . REPLACEMENT TOTAL KNEE  08/21/2006   left knee and right knee  . US ECHOCARDIOGRAPHY  06/25/2007   mild LVH,LA mildly dilated,mild to mod mitral annular ca+,mild MR,mild to mod TR,AOV mildly sclerotic,aortic root sclerosis    Outpatient Medications Prior to Visit  Medication Sig Dispense Refill  .  aspirin 325 MG tablet Take 325 mg by mouth daily.    Marland Kitchen FLUOCINOLONE ACETONIDE SCALP 0.01 % OIL Apply to scalp nightly in areas of scale and itch.    . hydrocortisone 2.5 % cream Apply to scaly itchy areas of face once daily as needed    . levothyroxine (SYNTHROID, LEVOTHROID) 50 MCG tablet Take 50 mcg by mouth daily before breakfast. Except Wednesdays and Sundays.    Marland Kitchen lisinopril-hydrochlorothiazide (PRINZIDE,ZESTORETIC) 20-25 MG per tablet Take 1 tablet by mouth daily.    . Multiple Vitamin (MULTIVITAMIN) tablet Take 1 tablet by mouth daily.    Marland Kitchen oxybutynin (DITROPAN-XL) 10 MG 24 hr tablet Take 10 mg by mouth daily.    . rosuvastatin (CRESTOR) 20 MG tablet Take 20 mg by mouth daily.    Marland Kitchen SIMBRINZA 1-0.2 % SUSP   0  . TRAVATAN Z 0.004 % SOLN ophthalmic solution Place 1 drop into both eyes at bedtime.    . Iron-FA-B Cmp-C-Biot-Probiotic (FUSION PLUS) CAPS      No facility-administered medications prior to visit.      Allergies:   Patient has no known allergies.   Social History   Social History  . Marital status: Married    Spouse name: N/A  . Number of children: N/A  . Years of education: N/A   Social History Main Topics  . Smoking status: Never Smoker  . Smokeless tobacco: Never Used  . Alcohol use No  . Drug use: No  . Sexual  activity: Not Asked   Other Topics Concern  . None   Social History Narrative  . None     Family History:  The patient's family history includes Diabetes in her father; Heart failure in her father and mother; Kidney disease in her mother.   ROS:   Please see the history of present illness.    ROS All other systems reviewed and are negative.   PHYSICAL EXAM:   VS:  BP 120/70 (BP Location: Left Arm, Patient Position: Sitting, Cuff Size: Large)   Pulse (!) 59   Ht 4\' 11"  (1.499 m)   Wt 99.5 kg (219 lb 6.4 oz)   BMI 44.31 kg/m    GEN: Well nourished, well developed, in no acute distress  HEENT: normal  Neck: no JVD, carotid bruits, or  masses Cardiac: RRR; no murmurs, rubs, or gallops,no edema  Respiratory:  clear to auscultation bilaterally, normal work of breathing GI: soft, nontender, nondistended, + BS MS: no deformity or atrophy  Skin: warm and dry, no rash Neuro:  Alert and Oriented x 3, Strength and sensation are intact Psych: euthymic mood, full affect  Wt Readings from Last 3 Encounters:  07/25/16 99.5 kg (219 lb 6.4 oz)  07/22/15 98 kg (216 lb)  03/19/14 99.3 kg (219 lb)      Studies/Labs Reviewed:   EKG:  EKG is ordered today.  The ekg ordered today demonstrvery mild sinus bradycardia, first-degree AV block with PR interval 220 ms, otherwise normal    ASSESSMENT:    1. Essential hypertension   2. Pure hypercholesterolemia   3. Morbid obesity ([place])   4. First degree AV block   5. Bradycardia      PLAN:  In order of problems listed above:  1. HTN: excellent blood pressure control; reportedly stable renal function on ACE inhibitor/thiazide diuretic combination 2. HLP: Per patient, recent favorable lipid profile 3. Obesity: We spent a long time discussing healthy changes to her diet, reducing the intake of carbohydrates and increasing the intake of protein rich food, especially with her morning meal; also reviewed exercise  4. Sinus bradycardia: she has minimal bradycardia at this time but also has a mild first-degree AV block; bradycardia was pronounced in the past when she received glaucoma eyedrops with beta blockers. He sees an endocrinologist who manages her levothyroxine supplements, reports that labs showed appropriate amount replacement. Avoid negative chronotropic agents    Medication Adjustments/Labs and Tests Ordered: Current medicines are reviewed at length with the patient today.  Concerns regarding medicines are outlined above.  Medication changes, Labs and Tests ordered today are listed in the Patient Instructions below. Patient Instructions  Dr Sallyanne Kuster recommends that you schedule  a follow-up appointment in 1 year. You will receive a reminder letter in the mail two months in advance. If you don't receive a letter, please call our office to schedule the follow-up appointment.  If you need a refill on your cardiac medications before your next appointment, please call your pharmacy.      Signed, Sanda Klein, MD  07/25/2016 3:34 PM    [facility] [address], [place], [place]  [address] Phone: [phone]; Fax: [phone]

## 2016-07-25 NOTE — Patient Instructions (Signed)
Dr Croitoru recommends that you schedule a follow-up appointment in 1 year. You will receive a reminder letter in the mail two months in advance. If you don't receive a letter, please call our office to schedule the follow-up appointment.  If you need a refill on your cardiac medications before your next appointment, please call your pharmacy. 

## 2017-09-08 ENCOUNTER — Encounter: Payer: Self-pay | Admitting: Cardiovascular Disease

## 2017-09-08 ENCOUNTER — Ambulatory Visit: Payer: Medicare Other | Admitting: Cardiovascular Disease

## 2017-09-08 VITALS — BP 140/68 | Ht 59.0 in | Wt 218.2 lb

## 2017-09-08 DIAGNOSIS — R001 Bradycardia, unspecified: Secondary | ICD-10-CM

## 2017-09-08 DIAGNOSIS — I10 Essential (primary) hypertension: Secondary | ICD-10-CM | POA: Diagnosis not present

## 2017-09-08 DIAGNOSIS — E7800 Pure hypercholesterolemia, unspecified: Secondary | ICD-10-CM

## 2017-09-08 NOTE — Progress Notes (Signed)
Patient ID: Alexandra Dixon, female   DOB: [birth year], 82 y.o.   MRN: [id number]    Cardiology Office Note    Date:  09/08/2017   ID:  Alexandra Dixon, DOB 1935/09/09, MRN [id number]  PCP:  Lucianne Lei, MD  Cardiologist: Sanda Klein, MD   No chief complaint on file.   History of Present Illness:  Alexandra Dixon is a 82 y.o. female with morbid obesity, treated systemic hypertension (with LVH) , hypothyroidism, hyperlipidemia on statin therapy and mild to moderate tricuspid insufficiency , who also has mild renal insufficiency.   There were questions regarding bradycardia, but this resolved after duration of a beta-blocker-containing eyedrop for glaucoma.  Her blood pressure was previously well controlled, but after starting Myrbetriq has gone up by roughly 10 mmHg.  She had labs with Dr. Criss Rosales and Dr. Graylon Gunning and the results were favorable by her report.  She has had problems with elevated potassium levels.  These have normalized after she reduced her intake of potassium rich foods.  The patient specifically denies any chest pain at rest exertion, dyspnea at rest or with exertion, orthopnea, paroxysmal nocturnal dyspnea, syncope, palpitations, focal neurological deficits, intermittent claudication, lower extremity edema, unexplained weight gain, cough, hemoptysis or wheezing.   Past Medical History:  Diagnosis Date  . Glaucoma   . Heart murmur   . Hyperlipemia   . Hypothyroidism   . Obesity   . Sinus bradycardia   . Systemic hypertension     Past Surgical History:  Procedure Laterality Date  . NM MYOVIEW LTD  07/27/2006   low risk, cannot exclude subtle anteroapical ischemia  . REPLACEMENT TOTAL KNEE  08/21/2006   left knee and right knee  . US ECHOCARDIOGRAPHY  06/25/2007   mild LVH,LA mildly dilated,mild to mod mitral annular ca+,mild MR,mild to mod TR,AOV mildly sclerotic,aortic root sclerosis    Outpatient Medications Prior to Visit  Medication Sig Dispense Refill  .  aspirin 325 MG tablet Take 325 mg by mouth daily.    . ferrous sulfate 325 (65 FE) MG EC tablet Take 325 mg by mouth daily.    Marland Kitchen FLUOCINOLONE ACETONIDE SCALP 0.01 % OIL Apply to scalp nightly in areas of scale and itch.    . hydrocortisone 2.5 % cream Apply to scaly itchy areas of face once daily as needed    . levothyroxine (SYNTHROID, LEVOTHROID) 50 MCG tablet Take 50 mcg by mouth daily before breakfast. Except Wednesdays and Sundays.    Marland Kitchen lisinopril-hydrochlorothiazide (PRINZIDE,ZESTORETIC) 20-25 MG per tablet Take 1 tablet by mouth daily.    . Multiple Vitamin (MULTIVITAMIN) tablet Take 1 tablet by mouth daily.    . rosuvastatin (CRESTOR) 20 MG tablet Take 20 mg by mouth daily.    Marland Kitchen SIMBRINZA 1-0.2 % SUSP   0  . TRAVATAN Z 0.004 % SOLN ophthalmic solution Place 1 drop into both eyes at bedtime.    Marland Kitchen oxybutynin (DITROPAN-XL) 10 MG 24 hr tablet Take 10 mg by mouth daily.     No facility-administered medications prior to visit.      Allergies:   Patient has no known allergies.   Social History   Socioeconomic History  . Marital status: Married    Spouse name: Not on file  . Number of children: Not on file  . Years of education: Not on file  . Highest education level: Not on file  Occupational History  . Not on file  Social Needs  . Financial resource strain: Not on  file  . Food insecurity:    Worry: Not on file    Inability: Not on file  . Transportation needs:    Medical: Not on file    Non-medical: Not on file  Tobacco Use  . Smoking status: Never Smoker  . Smokeless tobacco: Never Used  Substance and Sexual Activity  . Alcohol use: No  . Drug use: No  . Sexual activity: Not on file  Lifestyle  . Physical activity:    Days per week: Not on file    Minutes per session: Not on file  . Stress: Not on file  Relationships  . Social connections:    Talks on phone: Not on file    Gets together: Not on file    Attends religious service: Not on file    Active member of  club or organization: Not on file    Attends meetings of clubs or organizations: Not on file    Relationship status: Not on file  Other Topics Concern  . Not on file  Social History Narrative  . Not on file     Family History:  The patient's family history includes Diabetes in her father; Heart failure in her father and mother; Kidney disease in her mother.   ROS:   Please see the history of present illness.    ROS All other systems reviewed and are negative.   PHYSICAL EXAM:   VS:  BP 140/68   Ht 4\' 11"  (1.499 m)   Wt 218 lb 3.2 oz (99 kg)   BMI 44.07 kg/m      General: Alert, oriented x3, no distress, morbidly obese Head: no evidence of trauma, PERRL, EOMI, no exophtalmos or lid lag, no myxedema, no xanthelasma; normal ears, nose and oropharynx Neck: normal jugular venous pulsations and no hepatojugular reflux; brisk carotid pulses without delay and no carotid bruits Chest: clear to auscultation, no signs of consolidation by percussion or palpation, normal fremitus, symmetrical and full respiratory excursions Cardiovascular: normal position and quality of the apical impulse, regular rhythm, normal first and second heart sounds, early peaking 1/6 systolic ejection murmur in the aortic focus, no diastolic murmurs, rubs or gallops Abdomen: no tenderness or distention, no masses by palpation, no abnormal pulsatility or arterial bruits, normal bowel sounds, no hepatosplenomegaly Extremities: no clubbing, cyanosis or edema; 2+ radial, ulnar and brachial pulses bilaterally; 2+ right femoral, posterior tibial and dorsalis pedis pulses; 2+ left femoral, posterior tibial and dorsalis pedis pulses; no subclavian or femoral bruits Neurological: grossly nonfocal Psych: Normal mood and affect   Wt Readings from Last 3 Encounters:  09/08/17 218 lb 3.2 oz (99 kg)  07/25/16 219 lb 6.4 oz (99.5 kg)  07/22/15 216 lb (98 kg)      Studies/Labs Reviewed:   EKG:  EKG is ordered today.  The  ekg ordered today shows normal sinus rhythm, normal tracing  ASSESSMENT:    1. Essential hypertension   2. Pure hypercholesterolemia   3. Morbid obesity ([place])   4. Bradycardia      PLAN:  In order of problems listed above:  1. HTN: Blood pressure control has deteriorated slightly since taking Myrbetriq.  On ACE inhibitor/thiazide diuretic combination.  If the dose is increased, need to watch renal function potassium level.  Will wait until she reaches a final decision regarding long-term treatment with Myrbetriq. 2. HLP: Most recent lipid profile from Dr. Criss Rosales 3. Obesity: Is probably room for more physical activity she is quite frustrated that she cannot  lose more weight. 4. Sinus bradycardia: Resolved with avoidance of negative chronotropic agents.  No indication for pacemaker at this time.   Medication Adjustments/Labs and Tests Ordered: Current medicines are reviewed at length with the patient today.  Concerns regarding medicines are outlined above.  Medication changes, Labs and Tests ordered today are listed in the Patient Instructions below. There are no Patient Instructions on file for this visit.     Signed, Sanda Klein, MD  09/08/2017 9:19 AM    [facility] [place], [place], [place]  [address] Phone: [phone]; Fax: [phone]

## 2017-09-08 NOTE — Patient Instructions (Signed)
Dr Croitoru recommends that you schedule a follow-up appointment in 12 months. You will receive a reminder letter in the mail two months in advance. If you don't receive a letter, please call our office to schedule the follow-up appointment.  If you need a refill on your cardiac medications before your next appointment, please call your pharmacy. 

## 2017-09-08 NOTE — Addendum Note (Signed)
Addended by: Sanda Klein on: 09/08/2017 09:20 AM   Modules accepted: Level of Service

## 2018-10-10 ENCOUNTER — Telehealth: Payer: Self-pay

## 2018-10-10 NOTE — Telephone Encounter (Signed)
Called patient to discuss virtual visit and she stated she was out at [employer] and will give the office a call when she gets home.

## 2018-10-15 NOTE — Telephone Encounter (Signed)
Called patient to get consent she states now is not a good time to talk because she was on her way to [facility] to pick up her grandson. She stated she will not be able to keep her appt for tomorrow because she need to go to the attorney's office in the morning. She stated her husband just passed away and she is doing a lot of running around right now. She promised to contact the office in the morning to reschedule her appt and is thinking of moving her appt to August. I encouraged her to at least do a phone visit to speak with Dr C so that he may at least see how she is doing. She voiced understanding and again said she would call tomorrow.

## 2018-10-16 ENCOUNTER — Ambulatory Visit: Payer: Medicare Other | Admitting: Cardiovascular Disease

## 2019-02-20 ENCOUNTER — Other Ambulatory Visit: Payer: Self-pay

## 2019-02-20 ENCOUNTER — Ambulatory Visit (INDEPENDENT_AMBULATORY_CARE_PROVIDER_SITE_OTHER): Payer: Medicare Other | Admitting: Cardiovascular Disease

## 2019-02-20 ENCOUNTER — Encounter: Payer: Self-pay | Admitting: Cardiovascular Disease

## 2019-02-20 VITALS — BP 126/70 | HR 67 | Temp 97.1°F | Ht 59.0 in | Wt 218.4 lb

## 2019-02-20 DIAGNOSIS — N183 Chronic kidney disease, stage 3 unspecified: Secondary | ICD-10-CM

## 2019-02-20 DIAGNOSIS — I10 Essential (primary) hypertension: Secondary | ICD-10-CM | POA: Diagnosis not present

## 2019-02-20 DIAGNOSIS — E7800 Pure hypercholesterolemia, unspecified: Secondary | ICD-10-CM

## 2019-02-20 DIAGNOSIS — I440 Atrioventricular block, first degree: Secondary | ICD-10-CM

## 2019-02-20 DIAGNOSIS — R011 Cardiac murmur, unspecified: Secondary | ICD-10-CM

## 2019-02-20 DIAGNOSIS — R001 Bradycardia, unspecified: Secondary | ICD-10-CM

## 2019-02-20 MED ORDER — ASPIRIN EC 81 MG PO TBEC: 81.0000 mg | DELAYED_RELEASE_TABLET | Freq: Every day | ORAL | Status: DC

## 2019-02-20 NOTE — Progress Notes (Signed)
Patient ID: Alexandra Dixon, female   DOB: 05-Feb-1936, 83 y.o.   MRN: [id number]    Cardiology Office Note    Date:  02/21/2019   ID:  Alexandra Dixon, DOB 1936-04-21, MRN [id number]  PCP:  Alexandra Lei, MD  Cardiologist: Alexandra Klein, MD   Chief Complaint  Patient presents with  . Hyperlipidemia    History of Present Illness:  Alexandra Dixon is a 83 y.o. female with morbid obesity, treated systemic hypertension (with LVH) , hypothyroidism, hyperlipidemia on statin therapy and mild to moderate tricuspid insufficiency , who also has mild renal insufficiency.   She is generally had a good year without any serious health problems. The patient specifically denies any chest pain at rest exertion, dyspnea at rest or with exertion, orthopnea, paroxysmal nocturnal dyspnea, syncope, palpitations, focal neurological deficits, intermittent claudication, lower extremity edema, unexplained weight gain, cough, hemoptysis or wheezing.  In the past she had problems of bradycardia that resolved after stopping a beta-blocker containing glaucoma eyedrops.  Her blood pressure was high when she first checked in today but was much better when I retested.    She has had problems with hyperkalemia in the past and is trying to avoid potassium rich foods.  Her most recent creatinine was 1.94 and potassium of 5.3 a couple of months ago.  She sees Alexandra Dixon.  She had a lipid profile performed in June which showed an LDL cholesterol of 193, but during that period of time she had been off cholesterol medications for a few weeks, due to supply issues during the coronavirus pandemic.  She is back on medication now.  She has always had a systolic murmur of tricuspid regurgitation but now has a much more distinct and louder crescendo-decrescendo aortic ejection murmur at the right upper sternal border.  She has not had an echocardiogram in well over 10 years  Past Medical History:  Diagnosis Date  . Glaucoma   . Heart  murmur   . Hyperlipemia   . Hypothyroidism   . Obesity   . Sinus bradycardia   . Systemic hypertension     Past Surgical History:  Procedure Laterality Date  . NM MYOVIEW LTD  07/27/2006   low risk, cannot exclude subtle anteroapical ischemia  . REPLACEMENT TOTAL KNEE  08/21/2006   left knee and right knee  . US ECHOCARDIOGRAPHY  06/25/2007   mild LVH,LA mildly dilated,mild to mod mitral annular ca+,mild MR,mild to mod TR,AOV mildly sclerotic,aortic root sclerosis    Outpatient Medications Prior to Visit  Medication Sig Dispense Refill  . ferrous sulfate 325 (65 FE) MG EC tablet Take 325 mg by mouth daily.    Marland Kitchen FLUOCINOLONE ACETONIDE SCALP 0.01 % OIL Apply to scalp nightly in areas of scale and itch.    . hydrocortisone 2.5 % cream Apply to scaly itchy areas of face once daily as needed    . levothyroxine (SYNTHROID, LEVOTHROID) 50 MCG tablet Take 50 mcg by mouth daily before breakfast. Except Wednesdays and Sundays.    Marland Kitchen lisinopril-hydrochlorothiazide (PRINZIDE,ZESTORETIC) 20-25 MG per tablet Take 1 tablet by mouth daily.    . Multiple Vitamin (MULTIVITAMIN) tablet Take 1 tablet by mouth daily.    Marland Kitchen MYRBETRIQ 50 MG TB24 tablet Take 50 mg by mouth daily.    . rosuvastatin (CRESTOR) 20 MG tablet Take 20 mg by mouth daily.    Marland Kitchen SIMBRINZA 1-0.2 % SUSP   0  . TRAVATAN Z 0.004 % SOLN ophthalmic solution Place 1 drop  into both eyes at bedtime.    Marland Kitchen aspirin 325 MG tablet Take 325 mg by mouth daily.     No facility-administered medications prior to visit.      Allergies:   Patient has no known allergies.   Social History   Socioeconomic History  . Marital status: Married    Spouse name: Not on file  . Number of children: Not on file  . Years of education: Not on file  . Highest education level: Not on file  Occupational History  . Not on file  Social Needs  . Financial resource strain: Not on file  . Food insecurity    Worry: Not on file    Inability: Not on file  .  Transportation needs    Medical: Not on file    Non-medical: Not on file  Tobacco Use  . Smoking status: Never Smoker  . Smokeless tobacco: Never Used  Substance and Sexual Activity  . Alcohol use: No  . Drug use: No  . Sexual activity: Not on file  Lifestyle  . Physical activity    Days per week: Not on file    Minutes per session: Not on file  . Stress: Not on file  Relationships  . Social Herbalist on phone: Not on file    Gets together: Not on file    Attends religious service: Not on file    Active member of club or organization: Not on file    Attends meetings of clubs or organizations: Not on file    Relationship status: Not on file  Other Topics Concern  . Not on file  Social History Narrative  . Not on file     Family History:  The patient's family history includes Diabetes in her father; Heart failure in her father and mother; Kidney disease in her mother.   ROS:   Please see the history of present illness.    ROS All other systems are reviewed and are negative  PHYSICAL EXAM:   VS:  BP 126/70   Pulse 67   Temp (!) 97.1 F (36.2 C)   Ht 4\' 11"  (1.499 m)   Wt 218 lb 6.4 oz (99.1 kg)   SpO2 96%   BMI 44.11 kg/m      General: Alert, oriented x3, no distress, morbidly obese Head: no evidence of trauma, PERRL, EOMI, no exophtalmos or lid lag, no myxedema, no xanthelasma; normal ears, nose and oropharynx Neck: normal jugular venous pulsations and no hepatojugular reflux; brisk carotid pulses without delay and no carotid bruits Chest: clear to auscultation, no signs of consolidation by percussion or palpation, normal fremitus, symmetrical and full respiratory excursions Cardiovascular: normal position and quality of the apical impulse, regular rhythm, normal first and second heart sounds, 3/6 early to mid peaking ejection murmur at the right upper sternal border, 1/6 holosystolic murmur at the left lower sternal border, no diastolic murmurs murmurs,  rubs or gallops Abdomen: no tenderness or distention, no masses by palpation, no abnormal pulsatility or arterial bruits, normal bowel sounds, no hepatosplenomegaly Extremities: no clubbing, cyanosis or edema; 2+ radial, ulnar and brachial pulses bilaterally; 2+ right femoral, posterior tibial and dorsalis pedis pulses; 2+ left femoral, posterior tibial and dorsalis pedis pulses; no subclavian or femoral bruits Neurological: grossly nonfocal Psych: Normal mood and affect   Wt Readings from Last 3 Encounters:  02/20/19 218 lb 6.4 oz (99.1 kg)  09/08/17 218 lb 3.2 oz (99 kg)  07/25/16 219 lb  6.4 oz (99.5 kg)      Studies/Labs Reviewed:   EKG:  EKG is ordered today.  It shows normal sinus rhythm and is a normal tracing.  QTc 390 ms  Lipid Panel  November 28, 2018 total cholesterol 301, HDL 86, LDL 193, triglycerides 101  Labs November 28, 2018 hemoglobin A1c 5.7%, potassium 5.3, normal TSH and liver function tests December 24, 2018 creatinine 1.94   ASSESSMENT:    1. Essential hypertension   2. Hypercholesterolemia   3. Morbid obesity ([place])   4. First degree AV block   5. Bradycardia   6. Murmur      PLAN:  In order of problems listed above:  1. HTN: Well-controlled 2. HLP: Most recent lipid profile from Dr. Criss Rosales was performed when she was off medications for a few weeks.  No adjustments made to her treatment, retest while on rosuvastatin. 3. Obesity: The coronavirus restrictions have prevented her from going to the gym; she remains frustrated with her inability to lose weight.  She also has trouble keeping a vegetable/fruit-based diet due to her problems with high potassium. 4. Sinus bradycardia: Bradycardia first-degree AV block have resolved after we stopped beta-blockers.  Avoid any negative chronotropic agents. agents. No indication for pacemaker at this time. 5. Murmur: Exam suggestive of aortic stenosis.  Repeat an echocardiogram. 6. CKD 3: Followed by Dr. Posey Pronto.  Has a tendency  to hyperkalemia.  She is well educated about the need to avoid NSAIDs and other nephrotoxic agents.   Medication Adjustments/Labs and Tests Ordered: Current medicines are reviewed at length with the patient today.  Concerns regarding medicines are outlined above.  Medication changes, Labs and Tests ordered today are listed in the Patient Instructions below. Patient Instructions  Medication Instructions:  DECREASE the Aspirin to 81 mg once daily  If you need a refill on your cardiac medications before your next appointment, please call your pharmacy.   Lab work: None ordered If you have labs (blood work) drawn today and your tests are completely normal, you will receive your results only by: Marland Kitchen MyChart Message (if you have MyChart) OR . A paper copy in the mail If you have any lab test that is abnormal or we need to change your treatment, we will call you to review the results.  Testing/Procedures: Your physician has requested that you have an echocardiogram. Echocardiography is a painless test that uses sound waves to create images of your heart. It provides your doctor with information about the size and shape of your heart and how well your heart's chambers and valves are working. You may receive an ultrasound enhancing agent through an IV if needed to better visualize your heart during the echo.This procedure takes approximately one hour. There are no restrictions for this procedure. This will take place at the [address].    Follow-Up: At [facility], you and your health needs are our priority.  As part of our continuing mission to provide you with exceptional heart care, we have created designated Provider Care Teams.  These Care Teams include your primary Cardiologist (physician) and Advanced Practice Providers (APPs -  Physician Assistants and Nurse Practitioners) who all work together to provide you with the care you need, when you need it. You will need a follow  up appointment in 12 months.  Please call our office 2 months in advance to schedule this appointment.  You may see Alexandra Klein, MD or one of the following Advanced Practice  Providers on your designated Care Team: Almyra Deforest, [place] . Fabian Sharp, PA-C        Signed, Alexandra Klein, MD  02/21/2019 6:05 PM    [address], [place], [place]  [address] Phone: [phone]; Fax: [phone]

## 2019-02-20 NOTE — Patient Instructions (Signed)
Medication Instructions:  DECREASE the Aspirin to 81 mg once daily  If you need a refill on your cardiac medications before your next appointment, please call your pharmacy.   Lab work: None ordered If you have labs (blood work) drawn today and your tests are completely normal, you will receive your results only by: Marland Kitchen MyChart Message (if you have MyChart) OR . A paper copy in the mail If you have any lab test that is abnormal or we need to change your treatment, we will call you to review the results.  Testing/Procedures: Your physician has requested that you have an echocardiogram. Echocardiography is a painless test that uses sound waves to create images of your heart. It provides your doctor with information about the size and shape of your heart and how well your heart's chambers and valves are working. You may receive an ultrasound enhancing agent through an IV if needed to better visualize your heart during the echo.This procedure takes approximately one hour. There are no restrictions for this procedure. This will take place at the [address]    Follow-Up: At [facility], you and your health needs are our priority.  As part of our continuing mission to provide you with exceptional heart care, we have created designated Provider Care Teams.  These Care Teams include your primary Cardiologist (physician) and Advanced Practice Providers (APPs -  Physician Assistants and Nurse Practitioners) who all work together to provide you with the care you need, when you need it. You will need a follow up appointment in 12 months.  Please call our office 2 months in advance to schedule this appointment.  You may see Sanda Klein, MD or one of the following Advanced Practice Providers on your designated Care Team: [place], [place] . Fabian Sharp, PA-C

## 2019-02-21 ENCOUNTER — Encounter: Payer: Self-pay | Admitting: Cardiovascular Disease

## 2019-02-27 ENCOUNTER — Ambulatory Visit (HOSPITAL_COMMUNITY): Payer: Medicare Other | Attending: Cardiology

## 2019-02-27 ENCOUNTER — Other Ambulatory Visit: Payer: Self-pay

## 2019-02-27 ENCOUNTER — Other Ambulatory Visit (INDEPENDENT_AMBULATORY_CARE_PROVIDER_SITE_OTHER): Payer: Medicare Other

## 2019-02-27 DIAGNOSIS — I10 Essential (primary) hypertension: Secondary | ICD-10-CM

## 2019-02-27 DIAGNOSIS — E7800 Pure hypercholesterolemia, unspecified: Secondary | ICD-10-CM

## 2019-02-27 DIAGNOSIS — R011 Cardiac murmur, unspecified: Secondary | ICD-10-CM | POA: Diagnosis not present

## 2019-02-27 DIAGNOSIS — R001 Bradycardia, unspecified: Secondary | ICD-10-CM

## 2019-02-27 DIAGNOSIS — I440 Atrioventricular block, first degree: Secondary | ICD-10-CM

## 2019-06-27 DIAGNOSIS — L219 Seborrheic dermatitis, unspecified: Secondary | ICD-10-CM | POA: Diagnosis not present

## 2019-06-27 DIAGNOSIS — L249 Irritant contact dermatitis, unspecified cause: Secondary | ICD-10-CM | POA: Diagnosis not present

## 2019-06-27 DIAGNOSIS — L658 Other specified nonscarring hair loss: Secondary | ICD-10-CM | POA: Diagnosis not present

## 2019-06-27 DIAGNOSIS — L811 Chloasma: Secondary | ICD-10-CM | POA: Diagnosis not present

## 2019-07-09 DIAGNOSIS — H401131 Primary open-angle glaucoma, bilateral, mild stage: Secondary | ICD-10-CM | POA: Diagnosis not present

## 2019-07-09 DIAGNOSIS — H25813 Combined forms of age-related cataract, bilateral: Secondary | ICD-10-CM | POA: Diagnosis not present

## 2019-07-09 DIAGNOSIS — H16223 Keratoconjunctivitis sicca, not specified as Sjogren's, bilateral: Secondary | ICD-10-CM | POA: Diagnosis not present

## 2019-07-10 ENCOUNTER — Ambulatory Visit: Payer: Medicare PPO | Attending: Internal Medicine

## 2019-07-10 DIAGNOSIS — Z23 Encounter for immunization: Secondary | ICD-10-CM | POA: Insufficient documentation

## 2019-07-10 NOTE — Progress Notes (Signed)
   Covid-19 Vaccination Clinic  Name:  Alexandra Dixon    MRN: [id number] DOB: 05/15/1936  07/10/2019  Alexandra Dixon was observed post Covid-19 immunization for 15 minutes without incidence. She was provided with Vaccine Information Sheet and instruction to access the V-Safe system.   Alexandra Dixon was instructed to call 911 with any severe reactions post vaccine: Marland Kitchen Difficulty breathing  . Swelling of your face and throat  . A fast heartbeat  . A bad rash all over your body  . Dizziness and weakness    Immunizations Administered    Name Date Dose VIS Date Route   Pfizer COVID-19 Vaccine 07/10/2019 12:24 PM 0.3 mL 05/31/2019 Intramuscular   Manufacturer: [place]   Lot: [place] [id number]   [place]: [id number]

## 2019-07-12 DIAGNOSIS — I129 Hypertensive chronic kidney disease with stage 1 through stage 4 chronic kidney disease, or unspecified chronic kidney disease: Secondary | ICD-10-CM | POA: Diagnosis not present

## 2019-07-12 DIAGNOSIS — M130 Polyarthritis, unspecified: Secondary | ICD-10-CM | POA: Diagnosis not present

## 2019-07-12 DIAGNOSIS — E1169 Type 2 diabetes mellitus with other specified complication: Secondary | ICD-10-CM | POA: Diagnosis not present

## 2019-07-12 DIAGNOSIS — E038 Other specified hypothyroidism: Secondary | ICD-10-CM | POA: Diagnosis not present

## 2019-07-12 DIAGNOSIS — I440 Atrioventricular block, first degree: Secondary | ICD-10-CM | POA: Diagnosis not present

## 2019-07-31 ENCOUNTER — Ambulatory Visit: Payer: Medicare PPO | Attending: Internal Medicine

## 2019-07-31 DIAGNOSIS — Z23 Encounter for immunization: Secondary | ICD-10-CM | POA: Insufficient documentation

## 2019-07-31 NOTE — Progress Notes (Signed)
   Covid-19 Vaccination Clinic  Name:  CHRITINA OSIER    MRN: [id number] DOB: 04-Mar-1936  07/31/2019  [name] was observed post Covid-19 immunization for 15 minutes without incidence. She was provided with Vaccine Information Sheet and instruction to access the V-Safe system.   [name] was instructed to call 911 with any severe reactions post vaccine: Marland Kitchen Difficulty breathing  . Swelling of your face and throat  . A fast heartbeat  . A bad rash all over your body  . Dizziness and weakness    Immunizations Administered    Name Date Dose VIS Date Route   Pfizer COVID-19 Vaccine 07/31/2019  9:48 AM 0.3 mL 05/31/2019 Intramuscular   Manufacturer: [place]   Lot: [id number]   [place]: [id number]

## 2019-09-04 DIAGNOSIS — N183 Chronic kidney disease, stage 3 unspecified: Secondary | ICD-10-CM | POA: Diagnosis not present

## 2019-09-09 DIAGNOSIS — N2581 Secondary hyperparathyroidism of renal origin: Secondary | ICD-10-CM | POA: Diagnosis not present

## 2019-09-09 DIAGNOSIS — D631 Anemia in chronic kidney disease: Secondary | ICD-10-CM | POA: Diagnosis not present

## 2019-09-09 DIAGNOSIS — E875 Hyperkalemia: Secondary | ICD-10-CM | POA: Diagnosis not present

## 2019-09-09 DIAGNOSIS — I129 Hypertensive chronic kidney disease with stage 1 through stage 4 chronic kidney disease, or unspecified chronic kidney disease: Secondary | ICD-10-CM | POA: Diagnosis not present

## 2019-09-09 DIAGNOSIS — N183 Chronic kidney disease, stage 3 unspecified: Secondary | ICD-10-CM | POA: Diagnosis not present

## 2019-09-10 DIAGNOSIS — H25813 Combined forms of age-related cataract, bilateral: Secondary | ICD-10-CM | POA: Diagnosis not present

## 2019-09-10 DIAGNOSIS — H16223 Keratoconjunctivitis sicca, not specified as Sjogren's, bilateral: Secondary | ICD-10-CM | POA: Diagnosis not present

## 2019-09-10 DIAGNOSIS — H401131 Primary open-angle glaucoma, bilateral, mild stage: Secondary | ICD-10-CM | POA: Diagnosis not present

## 2019-09-27 DIAGNOSIS — E785 Hyperlipidemia, unspecified: Secondary | ICD-10-CM | POA: Diagnosis not present

## 2019-09-27 DIAGNOSIS — I129 Hypertensive chronic kidney disease with stage 1 through stage 4 chronic kidney disease, or unspecified chronic kidney disease: Secondary | ICD-10-CM | POA: Diagnosis not present

## 2019-09-27 DIAGNOSIS — E1169 Type 2 diabetes mellitus with other specified complication: Secondary | ICD-10-CM | POA: Diagnosis not present

## 2019-12-04 DIAGNOSIS — Z1231 Encounter for screening mammogram for malignant neoplasm of breast: Secondary | ICD-10-CM | POA: Diagnosis not present

## 2019-12-27 DIAGNOSIS — N289 Disorder of kidney and ureter, unspecified: Secondary | ICD-10-CM | POA: Diagnosis not present

## 2019-12-27 DIAGNOSIS — L279 Dermatitis due to unspecified substance taken internally: Secondary | ICD-10-CM | POA: Diagnosis not present

## 2019-12-27 DIAGNOSIS — M130 Polyarthritis, unspecified: Secondary | ICD-10-CM | POA: Diagnosis not present

## 2019-12-27 DIAGNOSIS — I129 Hypertensive chronic kidney disease with stage 1 through stage 4 chronic kidney disease, or unspecified chronic kidney disease: Secondary | ICD-10-CM | POA: Diagnosis not present

## 2019-12-27 DIAGNOSIS — E1100 Type 2 diabetes mellitus with hyperosmolarity without nonketotic hyperglycemic-hyperosmolar coma (NKHHC): Secondary | ICD-10-CM | POA: Diagnosis not present

## 2019-12-27 DIAGNOSIS — Z6841 Body Mass Index (BMI) 40.0 and over, adult: Secondary | ICD-10-CM | POA: Diagnosis not present

## 2019-12-27 DIAGNOSIS — I10 Essential (primary) hypertension: Secondary | ICD-10-CM | POA: Diagnosis not present

## 2020-01-03 DIAGNOSIS — E876 Hypokalemia: Secondary | ICD-10-CM | POA: Diagnosis not present

## 2020-01-03 DIAGNOSIS — H16223 Keratoconjunctivitis sicca, not specified as Sjogren's, bilateral: Secondary | ICD-10-CM | POA: Diagnosis not present

## 2020-01-03 DIAGNOSIS — H25813 Combined forms of age-related cataract, bilateral: Secondary | ICD-10-CM | POA: Diagnosis not present

## 2020-01-03 DIAGNOSIS — H401131 Primary open-angle glaucoma, bilateral, mild stage: Secondary | ICD-10-CM | POA: Diagnosis not present

## 2020-02-04 DIAGNOSIS — H04121 Dry eye syndrome of right lacrimal gland: Secondary | ICD-10-CM | POA: Diagnosis not present

## 2020-02-04 DIAGNOSIS — H401131 Primary open-angle glaucoma, bilateral, mild stage: Secondary | ICD-10-CM | POA: Diagnosis not present

## 2020-02-04 DIAGNOSIS — H25813 Combined forms of age-related cataract, bilateral: Secondary | ICD-10-CM | POA: Diagnosis not present

## 2020-02-04 DIAGNOSIS — H16223 Keratoconjunctivitis sicca, not specified as Sjogren's, bilateral: Secondary | ICD-10-CM | POA: Diagnosis not present

## 2020-03-06 DIAGNOSIS — I129 Hypertensive chronic kidney disease with stage 1 through stage 4 chronic kidney disease, or unspecified chronic kidney disease: Secondary | ICD-10-CM | POA: Diagnosis not present

## 2020-03-06 DIAGNOSIS — I10 Essential (primary) hypertension: Secondary | ICD-10-CM | POA: Diagnosis not present

## 2020-03-06 DIAGNOSIS — E038 Other specified hypothyroidism: Secondary | ICD-10-CM | POA: Diagnosis not present

## 2020-03-06 DIAGNOSIS — Z23 Encounter for immunization: Secondary | ICD-10-CM | POA: Diagnosis not present

## 2020-03-12 ENCOUNTER — Encounter: Payer: Self-pay | Admitting: Cardiovascular Disease

## 2020-03-12 ENCOUNTER — Other Ambulatory Visit: Payer: Self-pay

## 2020-03-12 ENCOUNTER — Ambulatory Visit: Payer: Medicare PPO | Admitting: Cardiovascular Disease

## 2020-03-12 VITALS — BP 153/67 | HR 63 | Ht 59.0 in | Wt 215.4 lb

## 2020-03-12 DIAGNOSIS — E7800 Pure hypercholesterolemia, unspecified: Secondary | ICD-10-CM | POA: Diagnosis not present

## 2020-03-12 DIAGNOSIS — I350 Nonrheumatic aortic (valve) stenosis: Secondary | ICD-10-CM

## 2020-03-12 DIAGNOSIS — I495 Sick sinus syndrome: Secondary | ICD-10-CM | POA: Diagnosis not present

## 2020-03-12 DIAGNOSIS — I10 Essential (primary) hypertension: Secondary | ICD-10-CM

## 2020-03-12 DIAGNOSIS — N1831 Chronic kidney disease, stage 3a: Secondary | ICD-10-CM

## 2020-03-12 NOTE — Patient Instructions (Signed)
Medication Instructions:  STOP the Myrbetriq *If you need a refill on your cardiac medications before your next appointment, please call your pharmacy*   Lab Work: None ordered If you have labs (blood work) drawn today and your tests are completely normal, you will receive your results only by: Marland Kitchen MyChart Message (if you have MyChart) OR . A paper copy in the mail If you have any lab test that is abnormal or we need to change your treatment, we will call you to review the results.   Testing/Procedures: None ordered   Follow-Up: At [facility], you and your health needs are our priority.  As part of our continuing mission to provide you with exceptional heart care, we have created designated Provider Care Teams.  These Care Teams include your primary Cardiologist (physician) and Advanced Practice Providers (APPs -  Physician Assistants and Nurse Practitioners) who all work together to provide you with the care you need, when you need it.  We recommend signing up for the patient portal called "MyChart".  Sign up information is provided on this After Visit Summary.  MyChart is used to connect with patients for Virtual Visits (Telemedicine).  Patients are able to view lab/test results, encounter notes, upcoming appointments, etc.  Non-urgent messages can be sent to your provider as well.   To learn more about what you can do with MyChart, go to [email or web address].    Your next appointment:   12 month(s)  The format for your next appointment:   In Person  Provider:   You may see Sanda Klein, MD or one of the following Advanced Practice Providers on your designated Care Team:    Almyra Deforest, PA-C  Fabian Sharp, PA-C or   Roby Lofts, [place]

## 2020-03-12 NOTE — Progress Notes (Signed)
Patient ID: Alexandra Dixon, female   DOB: 1936-05-01, 84 y.o.   MRN: [id number]    Cardiology Office Note    Date:  03/12/2020   ID:  Alexandra Dixon, DOB Jan 16, 1936, MRN [id number]  PCP:  Lucianne Lei, MD  Cardiologist: Sanda Klein, MD   Chief Complaint  Patient presents with  . Bradycardia    History of Present Illness:  Alexandra Dixon is a 84 y.o. female with morbid obesity, treated systemic hypertension (with LVH) , mild AS (echo Sep 2020),  hypothyroidism, hyperlipidemia on statin therapy and mild to moderate tricuspid insufficiency , who also has mild renal insufficiency.   A. fib: Of occasion she has had symptomatic bradycardia related to the use of glaucoma eyedrops with beta-blockers or brimonidine.  Most recently she did not tolerate treatment with [facility] so now she is taking dorzolamide and Rhopressa.  Her heart rate stays in the 60s when not receiving beta-blockers.  She does have a first-degree AV block as well.  She reports that her blood pressure has been 5-10 points higher since she was prescribed Myrbetriq.  Before taking this medication her typical systolic blood pressure was 120-130s.  She also states that this medication has not really helped her urinary leakage problems.  She has tried oxybutynin in the past.  She remains independent and generally feels well.  She denies angina or dyspnea with activity.  She has not had orthopnea, PND, lower extremity edema, claudication, angina at rest or with activity, syncope or palpitations.  She sees Dr. Posey Pronto for her CKD.  Past Medical History:  Diagnosis Date  . Glaucoma   . Heart murmur   . Hyperlipemia   . Hypothyroidism   . Obesity   . Sinus bradycardia   . Systemic hypertension     Past Surgical History:  Procedure Laterality Date  . NM MYOVIEW LTD  07/27/2006   low risk, cannot exclude subtle anteroapical ischemia  . REPLACEMENT TOTAL KNEE  08/21/2006   left knee and right knee  . US ECHOCARDIOGRAPHY   06/25/2007   mild LVH,LA mildly dilated,mild to mod mitral annular ca+,mild MR,mild to mod TR,AOV mildly sclerotic,aortic root sclerosis    Outpatient Medications Prior to Visit  Medication Sig Dispense Refill  . aspirin EC 81 MG tablet Take 1 tablet (81 mg total) by mouth daily.    . dorzolamide (TRUSOPT) 2 % ophthalmic solution     . ferrous sulfate 325 (65 FE) MG EC tablet Take 325 mg by mouth daily.    Marland Kitchen FLUOCINOLONE ACETONIDE SCALP 0.01 % OIL Apply to scalp nightly in areas of scale and itch.    . furosemide (LASIX) 20 MG tablet daily.    . hydrocortisone 2.5 % cream Apply to scaly itchy areas of face once daily as needed    . levothyroxine (SYNTHROID, LEVOTHROID) 50 MCG tablet Take 50 mcg by mouth daily before breakfast. Except Wednesdays and Sundays.    Marland Kitchen lisinopril-hydrochlorothiazide (PRINZIDE,ZESTORETIC) 20-25 MG per tablet Take 1 tablet by mouth daily.    . Multiple Vitamin (MULTIVITAMIN) tablet Take 1 tablet by mouth daily.    . RHOPRESSA 0.02 % SOLN     . rosuvastatin (CRESTOR) 20 MG tablet Take 20 mg by mouth daily.    Marland Kitchen SIMBRINZA 1-0.2 % SUSP   0  . TRAVATAN Z 0.004 % SOLN ophthalmic solution Place 1 drop into both eyes at bedtime.    Marland Kitchen MYRBETRIQ 50 MG TB24 tablet Take 50 mg by mouth daily.  No facility-administered medications prior to visit.     Allergies:   Patient has no known allergies.   Social History   Socioeconomic History  . Marital status: Married    Spouse name: Not on file  . Number of children: Not on file  . Years of education: Not on file  . Highest education level: Not on file  Occupational History  . Not on file  Tobacco Use  . Smoking status: Never Smoker  . Smokeless tobacco: Never Used  Substance and Sexual Activity  . Alcohol use: No  . Drug use: No  . Sexual activity: Not on file  Other Topics Concern  . Not on file  Social History Narrative  . Not on file   Social Determinants of Health   Financial Resource Strain:   .  Difficulty of Paying Living Expenses: Not on file  Food Insecurity:   . Worried About Charity fundraiser in the Last Year: Not on file  . Ran Out of Food in the Last Year: Not on file  Transportation Needs:   . Lack of Transportation (Medical): Not on file  . Lack of Transportation (Non-Medical): Not on file  Physical Activity:   . Days of Exercise per Week: Not on file  . Minutes of Exercise per Session: Not on file  Stress:   . Feeling of Stress : Not on file  Social Connections:   . Frequency of Communication with Friends and Family: Not on file  . Frequency of Social Gatherings with Friends and Family: Not on file  . Attends Religious Services: Not on file  . Active Member of Clubs or Organizations: Not on file  . Attends Archivist Meetings: Not on file  . Marital Status: Not on file     Family History:  The patient's family history includes Diabetes in her father; Heart failure in her father and mother; Kidney disease in her mother.   ROS:   Please see the history of present illness.    ROS All other systems are reviewed and are negative  PHYSICAL EXAM:   VS:  BP (!) 153/67   Pulse 63   Ht 4\' 11"  (1.499 m)   Wt 215 lb 6.4 oz (97.7 kg)   SpO2 100%   BMI 43.51 kg/m      General: Alert, oriented x3, no distress, morbidly obese Head: no evidence of trauma, PERRL, EOMI, no exophtalmos or lid lag, no myxedema, no xanthelasma; normal ears, nose and oropharynx Neck: normal jugular venous pulsations and no hepatojugular reflux; brisk carotid pulses without delay and no carotid bruits Chest: clear to auscultation, no signs of consolidation by percussion or palpation, normal fremitus, symmetrical and full respiratory excursions Cardiovascular: normal position and quality of the apical impulse, regular rhythm, normal first and second heart sounds, 2/6 systolic ejection murmur, no diastolic murmurs, rubs or gallops Abdomen: no tenderness or distention, no masses by  palpation, no abnormal pulsatility or arterial bruits, normal bowel sounds, no hepatosplenomegaly Extremities: no clubbing, cyanosis or edema; 2+ radial, ulnar and brachial pulses bilaterally; 2+ right femoral, posterior tibial and dorsalis pedis pulses; 2+ left femoral, posterior tibial and dorsalis pedis pulses; no subclavian or femoral bruits Neurological: grossly nonfocal Psych: Normal mood and affect    Wt Readings from Last 3 Encounters:  03/12/20 215 lb 6.4 oz (97.7 kg)  02/20/19 218 lb 6.4 oz (99.1 kg)  09/08/17 218 lb 3.2 oz (99 kg)      Studies/Labs Reviewed:  EKG:  EKG is ordered today.  Shows sinus rhythm with first-degree AV block and a QTc interval of 388 ms   ECHO 02/27/2019 1. The left ventricle has hyperdynamic systolic function, with an  ejection fraction of >65%. The cavity size was normal. Left ventricular  diastolic Doppler parameters are consistent with impaired relaxation.  Elevated left ventricular end-diastolic  pressure.  2. The right ventricle has normal systolic function. The cavity was  mildly enlarged. There is no increase in right ventricular wall thickness.  Right ventricular systolic pressure is normal with an estimated pressure  of 29.4 mmHg.  3. Left atrial size was moderately dilated.  4. Right atrial size was moderately dilated.  5. There is mild mitral annular calcification present.  6. The aortic valve is tricuspid. Moderate thickening of the aortic  valve. Moderate calcification of the aortic valve. There is mild aortic  stenosis. The mean AV gradient is 31mmHg, peak velocity 286cm/s and AVA  2.3cm2 by VTI.  7. The aorta is normal unless otherwise noted.   Lipid Panel  November 28, 2018 total cholesterol 301, HDL 86, LDL 193, triglycerides 101 December 27, 2019 total cholesterol 188, HDL 74, LDL 100, triglycerides 59  Labs November 28, 2018 hemoglobin A1c 5.7%, potassium 5.3, normal TSH and liver function tests December 24, 2018 creatinine  1.94 September 09, 2019 creatinine 1.54 December 27, 2019 hemoglobin 10.6, hemoglobin A1c 6%, normal liver function tests, potassium 4.5   ASSESSMENT:    1. Aortic valve stenosis, nonrheumatic   2. Essential hypertension   3. Hypercholesterolemia   4. Morbid obesity ([place])   5. SSS (sick sinus syndrome) (HCC)   6. Stage 3a chronic kidney disease      PLAN:  In order of problems listed above:  1. AS: Asymptomatic, mild by echo.  She should remain physically active and report angina/dyspnea/syncope with activity.  Otherwise we will recheck every 3-5 years by echo. 2. HTN: Myrbetriq is not helping her urological complaints and is having an adverse impact on her blood pressure.  I recommended that she discontinue it.  Discuss alternatives with her urologist. 3. HLP: Markedly improved LDL cholesterol level.  At target considering the fact that he does not have established CAD or PAD. 4. Obesity: He has noticed.  Weight loss strongly recommended.  Has not been able to exercise during the coronavirus pandemic. 5. Sinus bradycardia: Has evidence of conduction system disease with borderline sinus bradycardia and first-degree AV block that are always exacerbated by the use of any agents with negative chronotropic effect such as beta-blockers or brimonidine in her glaucoma eyedrops.  It should be avoided.  She might still need a pacemaker at some point in the future. 6. CKD 3: Stable renal function.  Followed by Dr. Posey Pronto.  Has a tendency to hyperkalemia.  She is well educated about the need to avoid NSAIDs and other nephrotoxic agents.   Medication Adjustments/Labs and Tests Ordered: Current medicines are reviewed at length with the patient today.  Concerns regarding medicines are outlined above.  Medication changes, Labs and Tests ordered today are listed in the Patient Instructions below. Patient Instructions  Medication Instructions:  STOP the Myrbetriq *If you need a refill on your cardiac  medications before your next appointment, please call your pharmacy*   Lab Work: None ordered If you have labs (blood work) drawn today and your tests are completely normal, you will receive your results only by: Marland Kitchen MyChart Message (if you have MyChart) OR . A paper copy  in the mail If you have any lab test that is abnormal or we need to change your treatment, we will call you to review the results.   Testing/Procedures: None ordered   Follow-Up: At [facility], you and your health needs are our priority.  As part of our continuing mission to provide you with exceptional heart care, we have created designated Provider Care Teams.  These Care Teams include your primary Cardiologist (physician) and Advanced Practice Providers (APPs -  Physician Assistants and Nurse Practitioners) who all work together to provide you with the care you need, when you need it.  We recommend signing up for the patient portal called "MyChart".  Sign up information is provided on this After Visit Summary.  MyChart is used to connect with patients for Virtual Visits (Telemedicine).  Patients are able to view lab/test results, encounter notes, upcoming appointments, etc.  Non-urgent messages can be sent to your provider as well.   To learn more about what you can do with MyChart, go to [email or web address].    Your next appointment:   12 month(s)  The format for your next appointment:   In Person  Provider:   You may see Sanda Klein, MD or one of the following Advanced Practice Providers on your designated Care Team:    Almyra Deforest, PA-C  Fabian Sharp, [place] or   Roby Lofts, PA-C         Signed, Sanda Klein, MD  03/12/2020 9:50 PM    [address], [place], [place]  [address] Phone: [phone]; Fax: [phone]

## 2020-05-25 DIAGNOSIS — Z20822 Contact with and (suspected) exposure to covid-19: Secondary | ICD-10-CM | POA: Diagnosis not present

## 2020-06-30 DIAGNOSIS — E785 Hyperlipidemia, unspecified: Secondary | ICD-10-CM | POA: Diagnosis not present

## 2020-06-30 DIAGNOSIS — I10 Essential (primary) hypertension: Secondary | ICD-10-CM | POA: Diagnosis not present

## 2020-06-30 DIAGNOSIS — E1169 Type 2 diabetes mellitus with other specified complication: Secondary | ICD-10-CM | POA: Diagnosis not present

## 2020-06-30 DIAGNOSIS — M130 Polyarthritis, unspecified: Secondary | ICD-10-CM | POA: Diagnosis not present

## 2020-06-30 DIAGNOSIS — E6609 Other obesity due to excess calories: Secondary | ICD-10-CM | POA: Diagnosis not present

## 2020-06-30 DIAGNOSIS — I440 Atrioventricular block, first degree: Secondary | ICD-10-CM | POA: Diagnosis not present

## 2020-06-30 DIAGNOSIS — I129 Hypertensive chronic kidney disease with stage 1 through stage 4 chronic kidney disease, or unspecified chronic kidney disease: Secondary | ICD-10-CM | POA: Diagnosis not present

## 2020-06-30 DIAGNOSIS — I110 Hypertensive heart disease with heart failure: Secondary | ICD-10-CM | POA: Diagnosis not present

## 2020-07-14 DIAGNOSIS — N183 Chronic kidney disease, stage 3 unspecified: Secondary | ICD-10-CM | POA: Diagnosis not present

## 2020-07-16 DIAGNOSIS — H25813 Combined forms of age-related cataract, bilateral: Secondary | ICD-10-CM | POA: Diagnosis not present

## 2020-07-16 DIAGNOSIS — H04121 Dry eye syndrome of right lacrimal gland: Secondary | ICD-10-CM | POA: Diagnosis not present

## 2020-07-16 DIAGNOSIS — H401131 Primary open-angle glaucoma, bilateral, mild stage: Secondary | ICD-10-CM | POA: Diagnosis not present

## 2020-07-16 DIAGNOSIS — H16223 Keratoconjunctivitis sicca, not specified as Sjogren's, bilateral: Secondary | ICD-10-CM | POA: Diagnosis not present

## 2020-07-20 DIAGNOSIS — N183 Chronic kidney disease, stage 3 unspecified: Secondary | ICD-10-CM | POA: Diagnosis not present

## 2020-07-20 DIAGNOSIS — N2581 Secondary hyperparathyroidism of renal origin: Secondary | ICD-10-CM | POA: Diagnosis not present

## 2020-07-20 DIAGNOSIS — I129 Hypertensive chronic kidney disease with stage 1 through stage 4 chronic kidney disease, or unspecified chronic kidney disease: Secondary | ICD-10-CM | POA: Diagnosis not present

## 2020-07-20 DIAGNOSIS — D631 Anemia in chronic kidney disease: Secondary | ICD-10-CM | POA: Diagnosis not present

## 2020-08-03 DIAGNOSIS — H401131 Primary open-angle glaucoma, bilateral, mild stage: Secondary | ICD-10-CM | POA: Diagnosis not present

## 2020-08-03 DIAGNOSIS — H1130 Conjunctival hemorrhage, unspecified eye: Secondary | ICD-10-CM | POA: Diagnosis not present

## 2020-08-03 DIAGNOSIS — H2513 Age-related nuclear cataract, bilateral: Secondary | ICD-10-CM | POA: Diagnosis not present

## 2020-08-03 DIAGNOSIS — H25813 Combined forms of age-related cataract, bilateral: Secondary | ICD-10-CM | POA: Diagnosis not present

## 2020-09-21 DIAGNOSIS — N183 Chronic kidney disease, stage 3 unspecified: Secondary | ICD-10-CM | POA: Diagnosis not present

## 2020-10-30 DIAGNOSIS — Z6841 Body Mass Index (BMI) 40.0 and over, adult: Secondary | ICD-10-CM | POA: Diagnosis not present

## 2020-10-30 DIAGNOSIS — M130 Polyarthritis, unspecified: Secondary | ICD-10-CM | POA: Diagnosis not present

## 2020-10-30 DIAGNOSIS — E1122 Type 2 diabetes mellitus with diabetic chronic kidney disease: Secondary | ICD-10-CM | POA: Diagnosis not present

## 2020-10-30 DIAGNOSIS — E6609 Other obesity due to excess calories: Secondary | ICD-10-CM | POA: Diagnosis not present

## 2020-10-30 DIAGNOSIS — E1169 Type 2 diabetes mellitus with other specified complication: Secondary | ICD-10-CM | POA: Diagnosis not present

## 2020-10-30 DIAGNOSIS — I10 Essential (primary) hypertension: Secondary | ICD-10-CM | POA: Diagnosis not present

## 2020-10-30 DIAGNOSIS — I129 Hypertensive chronic kidney disease with stage 1 through stage 4 chronic kidney disease, or unspecified chronic kidney disease: Secondary | ICD-10-CM | POA: Diagnosis not present

## 2020-10-30 DIAGNOSIS — N189 Chronic kidney disease, unspecified: Secondary | ICD-10-CM | POA: Diagnosis not present

## 2020-12-10 DIAGNOSIS — Z1231 Encounter for screening mammogram for malignant neoplasm of breast: Secondary | ICD-10-CM | POA: Diagnosis not present

## 2020-12-24 DIAGNOSIS — H401131 Primary open-angle glaucoma, bilateral, mild stage: Secondary | ICD-10-CM | POA: Diagnosis not present

## 2020-12-24 DIAGNOSIS — H2512 Age-related nuclear cataract, left eye: Secondary | ICD-10-CM | POA: Diagnosis not present

## 2021-03-16 DIAGNOSIS — H2512 Age-related nuclear cataract, left eye: Secondary | ICD-10-CM | POA: Diagnosis not present

## 2021-03-16 DIAGNOSIS — H401131 Primary open-angle glaucoma, bilateral, mild stage: Secondary | ICD-10-CM | POA: Diagnosis not present

## 2021-04-27 DIAGNOSIS — N183 Chronic kidney disease, stage 3 unspecified: Secondary | ICD-10-CM | POA: Diagnosis not present

## 2021-05-06 ENCOUNTER — Ambulatory Visit: Payer: Medicare PPO | Admitting: Cardiovascular Disease

## 2021-05-11 DIAGNOSIS — E1122 Type 2 diabetes mellitus with diabetic chronic kidney disease: Secondary | ICD-10-CM | POA: Diagnosis not present

## 2021-05-11 DIAGNOSIS — E1169 Type 2 diabetes mellitus with other specified complication: Secondary | ICD-10-CM | POA: Diagnosis not present

## 2021-05-11 DIAGNOSIS — Z6841 Body Mass Index (BMI) 40.0 and over, adult: Secondary | ICD-10-CM | POA: Diagnosis not present

## 2021-05-11 DIAGNOSIS — I10 Essential (primary) hypertension: Secondary | ICD-10-CM | POA: Diagnosis not present

## 2021-05-11 DIAGNOSIS — E6609 Other obesity due to excess calories: Secondary | ICD-10-CM | POA: Diagnosis not present

## 2021-05-11 DIAGNOSIS — I129 Hypertensive chronic kidney disease with stage 1 through stage 4 chronic kidney disease, or unspecified chronic kidney disease: Secondary | ICD-10-CM | POA: Diagnosis not present

## 2021-05-11 DIAGNOSIS — N189 Chronic kidney disease, unspecified: Secondary | ICD-10-CM | POA: Diagnosis not present

## 2021-05-11 DIAGNOSIS — M130 Polyarthritis, unspecified: Secondary | ICD-10-CM | POA: Diagnosis not present

## 2021-05-17 DIAGNOSIS — I129 Hypertensive chronic kidney disease with stage 1 through stage 4 chronic kidney disease, or unspecified chronic kidney disease: Secondary | ICD-10-CM | POA: Diagnosis not present

## 2021-05-17 DIAGNOSIS — N2581 Secondary hyperparathyroidism of renal origin: Secondary | ICD-10-CM | POA: Diagnosis not present

## 2021-05-17 DIAGNOSIS — D631 Anemia in chronic kidney disease: Secondary | ICD-10-CM | POA: Diagnosis not present

## 2021-05-17 DIAGNOSIS — N183 Chronic kidney disease, stage 3 unspecified: Secondary | ICD-10-CM | POA: Diagnosis not present

## 2021-06-01 DIAGNOSIS — E1169 Type 2 diabetes mellitus with other specified complication: Secondary | ICD-10-CM | POA: Diagnosis not present

## 2021-06-01 DIAGNOSIS — N189 Chronic kidney disease, unspecified: Secondary | ICD-10-CM | POA: Diagnosis not present

## 2021-06-01 DIAGNOSIS — I129 Hypertensive chronic kidney disease with stage 1 through stage 4 chronic kidney disease, or unspecified chronic kidney disease: Secondary | ICD-10-CM | POA: Diagnosis not present

## 2021-06-01 DIAGNOSIS — E6609 Other obesity due to excess calories: Secondary | ICD-10-CM | POA: Diagnosis not present

## 2021-06-01 DIAGNOSIS — N3281 Overactive bladder: Secondary | ICD-10-CM | POA: Diagnosis not present

## 2021-06-24 NOTE — Progress Notes (Signed)
Office Visit    Patient Name: Alexandra Dixon Date of Encounter: 06/25/2021  Primary Care Provider:  Lucianne Lei, MD Primary Cardiologist:  Sanda Klein, MD  Chief Complaint    86 year old female with a history of symptomatic bradycardia, mild AS, hypertension, hyperlipidemia, CKD stage III, and mild to moderate tricuspid insufficiency who presents for follow-up related to aortic stenosis and hypertension.  Past Medical History    Past Medical History:  Diagnosis Date   Glaucoma    Heart murmur    Hyperlipemia    Hypothyroidism    Obesity    Sinus bradycardia    Systemic hypertension    Past Surgical History:  Procedure Laterality Date   NM MYOVIEW LTD  07/27/2006   low risk, cannot exclude subtle anteroapical ischemia   REPLACEMENT TOTAL KNEE  08/21/2006   left knee and right knee   US ECHOCARDIOGRAPHY  06/25/2007   mild LVH,LA mildly dilated,mild to mod mitral annular ca+,mild MR,mild to mod TR,AOV mildly sclerotic,aortic root sclerosis    Allergies  Allergies  Allergen Reactions   Niacin Other (See Comments)    History of Present Illness   86 year old female with the above past medical history including symptomatic bradycardia, mild AS, hypertension, hyperlipidemia, CKD stage III, and mild to moderate tricuspid insufficiency.   Normal Myoview in 2008.  History of symptomatic bradycardia in the setting of eyedrops with beta-blockers/brimonidine.  Rate usually in the 60s even when not receiving beta-blockers, first-degree AV block.  Most recent echocardiogram 2020 showed EF 65%, elevated LVEDP, moderate BAE, mild mitral annular calcification, trivial MR, trivial TR, moderate calcification of aortic valve with mild AV stenosis.  She was last seen in the office on 03/12/2020 is doing well overall from a cardiac standpoint.  She was asymptomatic with her aortic stenosis.  Repeat echo was recommended every 3 to 5 years.  She did report more elevated blood pressures since  starting Myrbetriq for urological complaints, this was subsequently discontinued.   Since her last visit she has done well overall from a cardiac standpoint.  She does have baseline mild dyspnea on exertion, however, this is unchanged since her prior visit.  She has been less active recently and is not exercising regularly.  She is considering joining Silver sneakers like to increase her activity. She has been started [place] for overactive bladder. Her blood pressure is somewhat elevated in office today. She does report home readings in the 130s-140s/70s. She has also noticed a mild increase in lower extremity edema over the past week and has been taking her Lasix almost daily in the setting of increased sodium intake over the holidays. Her weight has been stable. She is also having some concerns with arthritis and joint pain. She is awaiting orthopedic referral per PCP. Otherwise, she reports feeling well overall and has no other concerns today.  Home Medications    Current Outpatient Medications  Medication Sig Dispense Refill   amLODipine (NORVASC) 2.5 MG tablet Take 1 tablet (2.5 mg total) by mouth daily. 180 tablet 3   aspirin EC 81 MG tablet Take 1 tablet (81 mg total) by mouth daily.     dorzolamide (TRUSOPT) 2 % ophthalmic solution      ferrous sulfate 325 (65 FE) MG EC tablet Take 325 mg by mouth daily.     FLUOCINOLONE ACETONIDE SCALP 0.01 % OIL Apply to scalp nightly in areas of scale and itch.     furosemide (LASIX) 20 MG tablet 20 mg as needed. Take  1 Tablet Daily As Needed for Swelling or weight of 3 lbs Over Night and 5 lbs. In a Week.     hydrocortisone 2.5 % cream Apply to scaly itchy areas of face once daily as needed     levothyroxine (SYNTHROID, LEVOTHROID) 50 MCG tablet Take 50 mcg by mouth daily before breakfast. Except Wednesdays and Sundays.     lisinopril-hydrochlorothiazide (PRINZIDE,ZESTORETIC) 20-25 MG per tablet Take 1 tablet by mouth daily.     Multiple Vitamin  (MULTIVITAMIN) tablet Take 1 tablet by mouth daily.     RHOPRESSA 0.02 % SOLN      rosuvastatin (CRESTOR) 20 MG tablet Take 20 mg by mouth daily.     TRAVATAN Z 0.004 % SOLN ophthalmic solution Place 1 drop into both eyes at bedtime.     Vibegron (GEMTESA) 75 MG TABS Take 75 mg by mouth. Take 1 Tablet daily     No current facility-administered medications for this visit.     Review of Systems    She denies chest pain, palpitations, pnd, orthopnea, n, v, dizziness, syncope, weight gain, or early satiety. All other systems reviewed and are otherwise negative except as noted above.   Physical Exam    VS:  BP (!) 150/60 (BP Location: Right Arm, Patient Position: Sitting)    Pulse 71    Ht 4\' 11"  (1.499 m)    Wt 211 lb 6.4 oz (95.9 kg)    SpO2 98%    BMI 42.70 kg/m  , BMI Body mass index is 42.7 kg/m. GEN: Well nourished, well developed, in no acute distress. HEENT: normal. Neck: Supple, no JVD, carotid bruits, or masses. Cardiac: RRR, no murmurs, rubs, or gallops. No clubbing, cyanosis, edema.  Radials/DP/PT 2+ and equal bilaterally.  Respiratory:  Respirations regular and unlabored, clear to auscultation bilaterally. GI: Soft, nontender, nondistended, BS + x 4. MS: no deformity or atrophy. Skin: warm and dry, no rash. Neuro:  Strength and sensation are intact. Psych: Normal affect.  Accessory Clinical Findings    ECG personally reviewed by me today - Sinus rhythm, 1st degree heart block, PACs, 71 bpm - no acute changes.  Lab Results  Component Value Date   WBC 7.1 08/30/2007   HGB 8.6 (L) 08/30/2007   HCT 25.3 (L) 08/30/2007   MCV 83.3 08/30/2007   PLT 189 08/30/2007   Lab Results  Component Value Date   CREATININE 0.95 08/30/2007   BUN 17 08/30/2007   NA 139 08/30/2007   K 4.6 SLIGHT HEMOLYSIS 08/30/2007   CL 106 08/30/2007   CO2 26 08/30/2007   Lab Results  Component Value Date   ALT 12 08/23/2007   AST 23 08/23/2007   ALKPHOS 80 08/23/2007   BILITOT 0.8  08/23/2007   No results found for: CHOL, HDL, LDLCALC, LDLDIRECT, TRIG, CHOLHDL  No results found for: HGBA1C  Assessment & Plan    1. Aortic stenosis: Mild per echo 9/202.  She reports stable mild dyspnea on exertion, unchanged from prior visit.  She is generally asymptomatic. I encouraged physical activity, and to report any symptoms of angina, dyspnea, syncope. She will be due for repeat echo at her follow-up visit next year (repeat every 3-5 years). Her blood pressure is slightly elevated today above goal, 150/70.  She states that her blood pressure readings at home have been in the 130s to 140s SBP.  Given history of CKD, following with nephrology, will add amlodipine 2.5 mg daily for additional blood pressure control.  She will monitor  home blood pressures and report blood pressure consistently> 130/80 or SBP consistently< 110.  Additionally, she does have some mild lower extremity edema.  She states that this is likely in the setting of increased salt intake and dietary indiscretion over the holidays. Otherwise, euvolemic and well compensated on exam. Continue Lasix daily as needed, lisinopril-hydrochlorothiazide.  2. Hypertension BP above goal.  We will start amlodipine 2.5 mg daily as above.  Otherwise, continue current antihypertensive regimen.  3. Sinus bradycardia borderline sinus bradycardia first-degree AV block: Previously exacerbated by use of beta-blockers or brimonidine and her glaucoma eyedrops. Per Dr. Lurline Del last note she may need pacemaker at some point. Heart rate stable today,71 bpm, sinus rhythm, first-degree AV block, PACs.  She denies palpitations, dizziness, syncope.  Continue to avoid AV nodal blocking agents.  4. Hyperlipidemia: LDL 99 on 05/11/2021.  Managed and monitored by PCP.  Continue ASA, Crestor.  5. CKD stage III: Creatinine 1.370 on 05/11/2021. She follows with nephrology.  Avoid NSAIDs and other nephrotoxic agents.  6. Obesity:  Encouraged lifestyle modifications  with diet and exercise.  She is considering joining Chief of Staff.  She also has a recumbent bicycle at home which she she will start using again.  6. Disposition: Follow-up in 1 year.  Lenna Sciara, NP 06/25/2021, 10:11 AM

## 2021-06-25 ENCOUNTER — Encounter: Payer: Self-pay | Admitting: Adult Health

## 2021-06-25 ENCOUNTER — Ambulatory Visit: Payer: Medicare PPO | Admitting: Nurse Practitioner

## 2021-06-25 ENCOUNTER — Other Ambulatory Visit: Payer: Self-pay

## 2021-06-25 VITALS — BP 150/60 | HR 71 | Ht 59.0 in | Wt 211.4 lb

## 2021-06-25 DIAGNOSIS — N1831 Chronic kidney disease, stage 3a: Secondary | ICD-10-CM | POA: Diagnosis not present

## 2021-06-25 DIAGNOSIS — I440 Atrioventricular block, first degree: Secondary | ICD-10-CM

## 2021-06-25 DIAGNOSIS — R001 Bradycardia, unspecified: Secondary | ICD-10-CM

## 2021-06-25 DIAGNOSIS — I350 Nonrheumatic aortic (valve) stenosis: Secondary | ICD-10-CM

## 2021-06-25 DIAGNOSIS — I10 Essential (primary) hypertension: Secondary | ICD-10-CM

## 2021-06-25 DIAGNOSIS — E782 Mixed hyperlipidemia: Secondary | ICD-10-CM

## 2021-06-25 MED ORDER — AMLODIPINE BESYLATE 2.5 MG PO TABS: 2.5000 mg | ORAL_TABLET | Freq: Every day | ORAL | 3 refills | Status: DC

## 2021-06-25 NOTE — Patient Instructions (Signed)
Medication Instructions:  Start Amlodipine 2.5 mg ( Take 1 Tablet Daily). *If you need a refill on your cardiac medications before your next appointment, please call your pharmacy*   Lab Work: No labs If you have labs (blood work) drawn today and your tests are completely normal, you will receive your results only by: [place] (if you have MyChart) OR A paper copy in the mail If you have any lab test that is abnormal or we need to change your treatment, we will call you to review the results.   Testing/Procedures: No Testing   Follow-Up: At [facility], you and your health needs are our priority.  As part of our continuing mission to provide you with exceptional heart care, we have created designated Provider Care Teams.  These Care Teams include your primary Cardiologist (physician) and Advanced Practice Providers (APPs -  Physician Assistants and Nurse Practitioners) who all work together to provide you with the care you need, when you need it.  We recommend signing up for the patient portal called "MyChart".  Sign up information is provided on this After Visit Summary.  MyChart is used to connect with patients for Virtual Visits (Telemedicine).  Patients are able to view lab/test results, encounter notes, upcoming appointments, etc.  Non-urgent messages can be sent to your provider as well.   To learn more about what you can do with MyChart, go to [email or web address].    Your next appointment:   1 year(s)  The format for your next appointment:   In Person  Provider:   Sanda Klein, MD

## 2021-08-03 DIAGNOSIS — M65341 Trigger finger, right ring finger: Secondary | ICD-10-CM | POA: Diagnosis not present

## 2021-08-03 DIAGNOSIS — M79642 Pain in left hand: Secondary | ICD-10-CM | POA: Diagnosis not present

## 2021-08-03 DIAGNOSIS — M79641 Pain in right hand: Secondary | ICD-10-CM | POA: Diagnosis not present

## 2021-08-03 DIAGNOSIS — M65331 Trigger finger, right middle finger: Secondary | ICD-10-CM | POA: Diagnosis not present

## 2021-08-10 DIAGNOSIS — M79641 Pain in right hand: Secondary | ICD-10-CM | POA: Diagnosis not present

## 2021-08-31 DIAGNOSIS — M65341 Trigger finger, right ring finger: Secondary | ICD-10-CM | POA: Diagnosis not present

## 2021-08-31 DIAGNOSIS — M65331 Trigger finger, right middle finger: Secondary | ICD-10-CM | POA: Diagnosis not present

## 2021-09-07 DIAGNOSIS — N183 Chronic kidney disease, stage 3 unspecified: Secondary | ICD-10-CM | POA: Diagnosis not present

## 2021-09-15 DIAGNOSIS — N2581 Secondary hyperparathyroidism of renal origin: Secondary | ICD-10-CM | POA: Diagnosis not present

## 2021-09-15 DIAGNOSIS — N183 Chronic kidney disease, stage 3 unspecified: Secondary | ICD-10-CM | POA: Diagnosis not present

## 2021-09-15 DIAGNOSIS — I129 Hypertensive chronic kidney disease with stage 1 through stage 4 chronic kidney disease, or unspecified chronic kidney disease: Secondary | ICD-10-CM | POA: Diagnosis not present

## 2021-09-15 DIAGNOSIS — D631 Anemia in chronic kidney disease: Secondary | ICD-10-CM | POA: Diagnosis not present

## 2021-09-23 DIAGNOSIS — H401131 Primary open-angle glaucoma, bilateral, mild stage: Secondary | ICD-10-CM | POA: Diagnosis not present

## 2021-09-23 DIAGNOSIS — H2512 Age-related nuclear cataract, left eye: Secondary | ICD-10-CM | POA: Diagnosis not present

## 2021-09-23 DIAGNOSIS — H402221 Chronic angle-closure glaucoma, left eye, mild stage: Secondary | ICD-10-CM | POA: Diagnosis not present

## 2021-09-29 DIAGNOSIS — M65331 Trigger finger, right middle finger: Secondary | ICD-10-CM | POA: Diagnosis not present

## 2021-09-29 DIAGNOSIS — M65341 Trigger finger, right ring finger: Secondary | ICD-10-CM | POA: Diagnosis not present

## 2021-10-15 DIAGNOSIS — H402221 Chronic angle-closure glaucoma, left eye, mild stage: Secondary | ICD-10-CM | POA: Diagnosis not present

## 2021-12-07 DIAGNOSIS — M19072 Primary osteoarthritis, left ankle and foot: Secondary | ICD-10-CM | POA: Diagnosis not present

## 2021-12-07 DIAGNOSIS — M79672 Pain in left foot: Secondary | ICD-10-CM | POA: Diagnosis not present

## 2021-12-07 DIAGNOSIS — M19071 Primary osteoarthritis, right ankle and foot: Secondary | ICD-10-CM | POA: Diagnosis not present

## 2021-12-07 DIAGNOSIS — M79671 Pain in right foot: Secondary | ICD-10-CM | POA: Diagnosis not present

## 2021-12-07 DIAGNOSIS — M65871 Other synovitis and tenosynovitis, right ankle and foot: Secondary | ICD-10-CM | POA: Diagnosis not present

## 2021-12-07 DIAGNOSIS — M65872 Other synovitis and tenosynovitis, left ankle and foot: Secondary | ICD-10-CM | POA: Diagnosis not present

## 2021-12-07 DIAGNOSIS — M25572 Pain in left ankle and joints of left foot: Secondary | ICD-10-CM | POA: Diagnosis not present

## 2021-12-14 DIAGNOSIS — M65871 Other synovitis and tenosynovitis, right ankle and foot: Secondary | ICD-10-CM | POA: Diagnosis not present

## 2021-12-14 DIAGNOSIS — M25571 Pain in right ankle and joints of right foot: Secondary | ICD-10-CM | POA: Diagnosis not present

## 2022-01-05 ENCOUNTER — Telehealth: Payer: Self-pay | Admitting: Cardiovascular Disease

## 2022-01-05 NOTE — Telephone Encounter (Signed)
Patient reported she got up too fast this AM and felt her heart pounding in her chest and racing (105-107), lasting 2 hours. She denies chest pain, sweating, dizziness, sob. Now, pulse is in 70s, BP 113/66.  Her nephrologist took her off amlodipine 2-3 months ago and put her on doxazosin '2mg'$  at night. This past Monday, her ankles were swollen so she took lasix '20mg'$  with relief. Patient does not weight daily. Explained purpose of daily weights and how to proceed with it. Patient's concern is the 2-hour episode of pounding, racing heart rate. She wanted to know when to call EMS. I explained that when she feels her heart pounding/racing and has sob, lightheadedness, sweating, chest pain, or nausea, then call EMS.

## 2022-01-05 NOTE — Telephone Encounter (Signed)
I suspect that the racing heartbeat that she experienced is just sinus tachycardia, trying to compensate for orthostatic hypotension, which is a very common side effect of the doxazosin, especially when she first gets up in the morning. Needs to stay well hydrated, and change position very gradually after she's been sitting or laying down. This is especially true first thing in the morning.

## 2022-01-05 NOTE — Telephone Encounter (Signed)
STAT if HR is under 50 or over 120 (normal HR is 60-100 beats per minute)  What is your heart rate? 92  Do you have a log of your heart rate readings (document readings)?  107 and 88 - this morning           Do you have any other symptoms?  No

## 2022-01-05 NOTE — Telephone Encounter (Signed)
Patient informed of Dr. Victorino December response...."I suspect that the racing heartbeat that she experienced is just sinus tachycardia, trying to compensate for orthostatic hypotension, which is a very common side effect of the doxazosin, especially when she first gets up in the morning. Needs to stay well hydrated, and change position very gradually after she's been sitting or laying down. This is especially true first thing in the morning." Explained to patient about dangling on edge of bed, standing for 20-30 seconds before walking, use support stockings, and stay hydrated. Also explained ankle pump exercises. Patient verbalized understanding.

## 2022-01-06 DIAGNOSIS — L811 Chloasma: Secondary | ICD-10-CM | POA: Diagnosis not present

## 2022-01-06 DIAGNOSIS — L658 Other specified nonscarring hair loss: Secondary | ICD-10-CM | POA: Diagnosis not present

## 2022-01-06 DIAGNOSIS — L249 Irritant contact dermatitis, unspecified cause: Secondary | ICD-10-CM | POA: Diagnosis not present

## 2022-01-06 DIAGNOSIS — I872 Venous insufficiency (chronic) (peripheral): Secondary | ICD-10-CM | POA: Diagnosis not present

## 2022-01-06 DIAGNOSIS — L219 Seborrheic dermatitis, unspecified: Secondary | ICD-10-CM | POA: Diagnosis not present

## 2022-01-18 DIAGNOSIS — Z1231 Encounter for screening mammogram for malignant neoplasm of breast: Secondary | ICD-10-CM | POA: Diagnosis not present

## 2022-01-26 DIAGNOSIS — N183 Chronic kidney disease, stage 3 unspecified: Secondary | ICD-10-CM | POA: Diagnosis not present

## 2022-01-26 DIAGNOSIS — N2581 Secondary hyperparathyroidism of renal origin: Secondary | ICD-10-CM | POA: Diagnosis not present

## 2022-01-27 DIAGNOSIS — H2512 Age-related nuclear cataract, left eye: Secondary | ICD-10-CM | POA: Diagnosis not present

## 2022-01-27 DIAGNOSIS — H402221 Chronic angle-closure glaucoma, left eye, mild stage: Secondary | ICD-10-CM | POA: Diagnosis not present

## 2022-01-27 DIAGNOSIS — H401131 Primary open-angle glaucoma, bilateral, mild stage: Secondary | ICD-10-CM | POA: Diagnosis not present

## 2022-02-04 DIAGNOSIS — I129 Hypertensive chronic kidney disease with stage 1 through stage 4 chronic kidney disease, or unspecified chronic kidney disease: Secondary | ICD-10-CM | POA: Diagnosis not present

## 2022-02-04 DIAGNOSIS — N2581 Secondary hyperparathyroidism of renal origin: Secondary | ICD-10-CM | POA: Diagnosis not present

## 2022-02-04 DIAGNOSIS — N1832 Chronic kidney disease, stage 3b: Secondary | ICD-10-CM | POA: Diagnosis not present

## 2022-02-04 DIAGNOSIS — D631 Anemia in chronic kidney disease: Secondary | ICD-10-CM | POA: Diagnosis not present

## 2022-02-07 DIAGNOSIS — M65331 Trigger finger, right middle finger: Secondary | ICD-10-CM | POA: Diagnosis not present

## 2022-03-07 DIAGNOSIS — M65331 Trigger finger, right middle finger: Secondary | ICD-10-CM | POA: Diagnosis not present

## 2022-03-14 DIAGNOSIS — N189 Chronic kidney disease, unspecified: Secondary | ICD-10-CM | POA: Diagnosis not present

## 2022-05-09 DIAGNOSIS — E559 Vitamin D deficiency, unspecified: Secondary | ICD-10-CM | POA: Diagnosis not present

## 2022-05-09 DIAGNOSIS — Z6841 Body Mass Index (BMI) 40.0 and over, adult: Secondary | ICD-10-CM | POA: Diagnosis not present

## 2022-05-09 DIAGNOSIS — Z0000 Encounter for general adult medical examination without abnormal findings: Secondary | ICD-10-CM | POA: Diagnosis not present

## 2022-05-09 DIAGNOSIS — M6284 Sarcopenia: Secondary | ICD-10-CM | POA: Diagnosis not present

## 2022-05-09 DIAGNOSIS — E1169 Type 2 diabetes mellitus with other specified complication: Secondary | ICD-10-CM | POA: Diagnosis not present

## 2022-05-09 DIAGNOSIS — I10 Essential (primary) hypertension: Secondary | ICD-10-CM | POA: Diagnosis not present

## 2022-05-09 DIAGNOSIS — E038 Other specified hypothyroidism: Secondary | ICD-10-CM | POA: Diagnosis not present

## 2022-05-09 DIAGNOSIS — I129 Hypertensive chronic kidney disease with stage 1 through stage 4 chronic kidney disease, or unspecified chronic kidney disease: Secondary | ICD-10-CM | POA: Diagnosis not present

## 2022-05-25 DIAGNOSIS — N1832 Chronic kidney disease, stage 3b: Secondary | ICD-10-CM | POA: Diagnosis not present

## 2022-05-26 DIAGNOSIS — H401131 Primary open-angle glaucoma, bilateral, mild stage: Secondary | ICD-10-CM | POA: Diagnosis not present

## 2022-05-26 DIAGNOSIS — H402221 Chronic angle-closure glaucoma, left eye, mild stage: Secondary | ICD-10-CM | POA: Diagnosis not present

## 2022-05-26 DIAGNOSIS — H2512 Age-related nuclear cataract, left eye: Secondary | ICD-10-CM | POA: Diagnosis not present

## 2022-06-06 DIAGNOSIS — N1832 Chronic kidney disease, stage 3b: Secondary | ICD-10-CM | POA: Diagnosis not present

## 2022-06-06 DIAGNOSIS — I129 Hypertensive chronic kidney disease with stage 1 through stage 4 chronic kidney disease, or unspecified chronic kidney disease: Secondary | ICD-10-CM | POA: Diagnosis not present

## 2022-06-06 DIAGNOSIS — N2581 Secondary hyperparathyroidism of renal origin: Secondary | ICD-10-CM | POA: Diagnosis not present

## 2022-06-06 DIAGNOSIS — D631 Anemia in chronic kidney disease: Secondary | ICD-10-CM | POA: Diagnosis not present

## 2022-06-30 DIAGNOSIS — H401131 Primary open-angle glaucoma, bilateral, mild stage: Secondary | ICD-10-CM | POA: Diagnosis not present

## 2022-06-30 DIAGNOSIS — H2512 Age-related nuclear cataract, left eye: Secondary | ICD-10-CM | POA: Diagnosis not present

## 2022-06-30 DIAGNOSIS — H402221 Chronic angle-closure glaucoma, left eye, mild stage: Secondary | ICD-10-CM | POA: Diagnosis not present

## 2022-08-09 DIAGNOSIS — I129 Hypertensive chronic kidney disease with stage 1 through stage 4 chronic kidney disease, or unspecified chronic kidney disease: Secondary | ICD-10-CM | POA: Diagnosis not present

## 2022-08-09 DIAGNOSIS — N189 Chronic kidney disease, unspecified: Secondary | ICD-10-CM | POA: Diagnosis not present

## 2022-08-16 DIAGNOSIS — Z96653 Presence of artificial knee joint, bilateral: Secondary | ICD-10-CM | POA: Diagnosis not present

## 2022-08-16 DIAGNOSIS — M25361 Other instability, right knee: Secondary | ICD-10-CM | POA: Diagnosis not present

## 2022-09-29 DIAGNOSIS — H402221 Chronic angle-closure glaucoma, left eye, mild stage: Secondary | ICD-10-CM | POA: Diagnosis not present

## 2022-09-29 DIAGNOSIS — H401131 Primary open-angle glaucoma, bilateral, mild stage: Secondary | ICD-10-CM | POA: Diagnosis not present

## 2022-09-29 DIAGNOSIS — H2512 Age-related nuclear cataract, left eye: Secondary | ICD-10-CM | POA: Diagnosis not present

## 2022-10-25 DIAGNOSIS — H401131 Primary open-angle glaucoma, bilateral, mild stage: Secondary | ICD-10-CM | POA: Diagnosis not present

## 2022-10-25 DIAGNOSIS — H2512 Age-related nuclear cataract, left eye: Secondary | ICD-10-CM | POA: Diagnosis not present

## 2022-10-25 DIAGNOSIS — H402221 Chronic angle-closure glaucoma, left eye, mild stage: Secondary | ICD-10-CM | POA: Diagnosis not present

## 2022-12-08 DIAGNOSIS — R2689 Other abnormalities of gait and mobility: Secondary | ICD-10-CM | POA: Diagnosis not present

## 2022-12-08 DIAGNOSIS — I129 Hypertensive chronic kidney disease with stage 1 through stage 4 chronic kidney disease, or unspecified chronic kidney disease: Secondary | ICD-10-CM | POA: Diagnosis not present

## 2022-12-08 DIAGNOSIS — H401131 Primary open-angle glaucoma, bilateral, mild stage: Secondary | ICD-10-CM | POA: Diagnosis not present

## 2022-12-08 DIAGNOSIS — E1169 Type 2 diabetes mellitus with other specified complication: Secondary | ICD-10-CM | POA: Diagnosis not present

## 2022-12-08 DIAGNOSIS — H402221 Chronic angle-closure glaucoma, left eye, mild stage: Secondary | ICD-10-CM | POA: Diagnosis not present

## 2022-12-08 DIAGNOSIS — Z6837 Body mass index (BMI) 37.0-37.9, adult: Secondary | ICD-10-CM | POA: Diagnosis not present

## 2022-12-08 DIAGNOSIS — M549 Dorsalgia, unspecified: Secondary | ICD-10-CM | POA: Diagnosis not present

## 2022-12-08 DIAGNOSIS — H2512 Age-related nuclear cataract, left eye: Secondary | ICD-10-CM | POA: Diagnosis not present

## 2022-12-08 DIAGNOSIS — H02889 Meibomian gland dysfunction of unspecified eye, unspecified eyelid: Secondary | ICD-10-CM | POA: Diagnosis not present

## 2022-12-20 DIAGNOSIS — N1832 Chronic kidney disease, stage 3b: Secondary | ICD-10-CM | POA: Diagnosis not present

## 2022-12-27 DIAGNOSIS — N2581 Secondary hyperparathyroidism of renal origin: Secondary | ICD-10-CM | POA: Diagnosis not present

## 2022-12-27 DIAGNOSIS — I129 Hypertensive chronic kidney disease with stage 1 through stage 4 chronic kidney disease, or unspecified chronic kidney disease: Secondary | ICD-10-CM | POA: Diagnosis not present

## 2022-12-27 DIAGNOSIS — D631 Anemia in chronic kidney disease: Secondary | ICD-10-CM | POA: Diagnosis not present

## 2022-12-27 DIAGNOSIS — N1832 Chronic kidney disease, stage 3b: Secondary | ICD-10-CM | POA: Diagnosis not present

## 2023-01-05 ENCOUNTER — Ambulatory Visit: Payer: Medicare PPO | Admitting: Physical Therapy

## 2023-01-05 ENCOUNTER — Encounter: Payer: Self-pay | Admitting: Physical Therapy

## 2023-01-05 DIAGNOSIS — M5459 Other low back pain: Secondary | ICD-10-CM

## 2023-01-05 DIAGNOSIS — R2689 Other abnormalities of gait and mobility: Secondary | ICD-10-CM

## 2023-01-05 DIAGNOSIS — M6281 Muscle weakness (generalized): Secondary | ICD-10-CM | POA: Diagnosis not present

## 2023-01-05 NOTE — Therapy (Unsigned)
OUTPATIENT PHYSICAL THERAPY LOWER EXTREMITY EVALUATION   Patient Name: Alexandra Dixon MRN: [id number] DOB:10/09/1935, 87 y.o., female Today's Date: 01/05/2023  END OF SESSION:  PT End of Session - 01/05/23 1141     Visit Number 1    Number of Visits 16    Date for PT Re-Evaluation 03/02/23    Authorization Type [employer]    PT Start Time 1020    PT Stop Time 1100    PT Time Calculation (min) 40 min    Equipment Utilized During Treatment Gait belt    Activity Tolerance Patient tolerated treatment well    Behavior During Therapy WFL for tasks assessed/performed             Past Medical History:  Diagnosis Date   Glaucoma    Heart murmur    Hyperlipemia    Hypothyroidism    Obesity    Sinus bradycardia    Systemic hypertension    Past Surgical History:  Procedure Laterality Date   NM MYOVIEW LTD  07/27/2006   low risk, cannot exclude subtle anteroapical ischemia   REPLACEMENT TOTAL KNEE  08/21/2006   left knee and right knee   US ECHOCARDIOGRAPHY  06/25/2007   mild LVH,LA mildly dilated,mild to mod mitral annular ca+,mild MR,mild to mod TR,AOV mildly sclerotic,aortic root sclerosis   Patient Active Problem List   Diagnosis Date Noted   First degree AV block 07/22/2015   Left carotid bruit 03/19/2014   Bradycardia 12/31/2012   Morbid obesity (HCC) 12/31/2012   Hyperlipidemia 12/31/2012   Glaucoma 12/31/2012   Essential hypertension 12/31/2012    PCP: Renaye Rakers  REFERRING PROVIDER: Renaye Rakers  REFERRING DIAG: Renaye Rakers  THERAPY DIAG:  Other low back pain  Muscle weakness (generalized)  Other abnormalities of gait and mobility  Rationale for Evaluation and Treatment: Rehabilitation  ONSET DATE:    SUBJECTIVE:   SUBJECTIVE STATEMENT: Pt reports multiple areas of concern.  Back is stiff, low back. - main complaint.  Bil knee TKA 22, 23, Dr. Valentina Gu.  Did make good outcome.  [facility], [facility]?  Bil feet injections lateral ankles.  Did help  some  1 yr ago. Ankles feel weak, Feels R foot/Leg is weaker.  Tingling in arms and back- Arms : down both arms few minutes only/intermittently Has L and R trigger fingers.  Feels "tingling" in low back, 2x/wk for just a  couple minutes, then goes away.  Brought on when Sitting too long Feels balance is decreased  Pain in R groin.  No AD.  Lives alone  PERTINENT HISTORY: Bradycardia, HTN,  PAIN:  Are you having pain? Yes: NPRS scale: 4/10 Pain location: low back  Pain description: Stiffness Aggravating factors: sitting too long  Relieving factors: none stated   PRECAUTIONS: Fall  WEIGHT BEARING RESTRICTIONS: No  FALLS:  Has patient fallen in last 6 months? Yes. Number of falls 3  PLOF: Independent  PATIENT GOALS:  Decreased pain , improved strength, mobility   NEXT MD VISIT:   OBJECTIVE:   DIAGNOSTIC FINDINGS:   PATIENT SURVEYS:   COGNITION: Overall cognitive status: Within functional limits for tasks assessed     SENSATION: WFL  EDEMA:   POSTURE:    No Significant postural limitations  PALPATION:   LOWER EXTREMITY ROM:  Shoulder Flexion: 125 bil;  Knees:  0- 90 deg bil  Lumbar: mild limitation for ext and SB, Hips: mild limitation for rotation    LOWER EXTREMITY MMT:  Shoulder strength: 3-/5  for flex and abd  Knees: 4/5  Hips: 4-/5   LOWER EXTREMITY SPECIAL TESTS:    TUG:  13.35 sec    TODAY'S TREATMENT:                                                                                                                              DATE:  Ther ex: see below for HEP   PATIENT EDUCATION:  Education details: PT POC, Exam findings, HEP Person educated: Patient Education method: Explanation, Demonstration, Tactile cues, Verbal cues, and Handouts Education comprehension: verbalized understanding, returned demonstration, verbal cues required, tactile cues required, and needs further education   HOME EXERCISE PROGRAM: Access Code:  [email or web address] URL: [email or web address] Date: 01/05/2023 Prepared by: Sedalia Muta  Exercises - Supine Lower Trunk Rotation  - 1-2 x daily - 10 reps - 5 hold - Supine Posterior Pelvic Tilt  - 1-2 x daily - 1 sets - 10 reps - 3 hold - Hooklying Single Knee to Chest Stretch with Towel  - 1-2 x daily - 3 reps - 15-20 hold  ASSESSMENT:  CLINICAL IMPRESSION: Patient presents with primary complaint of  stiffness and pain in low back. She has weakness in LEs and core, and has lack of effective HEP. She has decreased ability for full functional activities, ADLs, and IADLs, due to pain and deficit.  Pt will  benefit from skilled PT to improve deficits and pain and to return to PLOF. Focus will be on back pain, LE and core strength, and functional mobility/balance with gait and stairs.   OBJECTIVE IMPAIRMENTS: decreased activity tolerance, decreased mobility, decreased strength, increased muscle spasms, improper body mechanics, and pain.   ACTIVITY LIMITATIONS: carrying, lifting, standing, squatting, stairs, transfers, hygiene/grooming, and locomotion level  PARTICIPATION LIMITATIONS: meal prep, cleaning, laundry, shopping, and community activity  PERSONAL FACTORS: Past/current experiences and multiple pain locations   are also affecting patient's functional outcome.   REHAB POTENTIAL: Good  CLINICAL DECISION MAKING: Stable/uncomplicated  EVALUATION COMPLEXITY: Low   GOALS: Goals reviewed with patient? Yes  SHORT TERM GOALS: Target date: 01/19/2023  Pt to be independent with initial HEP  Goal status: INITIAL    LONG TERM GOALS: Target date: 03/02/2023  Pt to be independent with final HEP  Goal status: INITIAL  2.  Pt to demo ability for functional squat, bend, with mechanics WNL, to improve ability for IADLS and decreased pain pain.   Goal status: INITIAL  3.  Pt to demo improved strength of hips and core to be [facility] for pt age, to improve pain and function.   Goal  status: INITIAL  4.  Pt to demo ability for gait and dynamic activity with balance WFL for pt age, to improve fall risk and ability for community activity.   Goal status: INITIAL   PLAN:  PT FREQUENCY: 1-2x/week  PT DURATION: 8 weeks  PLANNED INTERVENTIONS: Therapeutic exercises, Therapeutic activity, Neuromuscular re-education, Patient/Family education, Self Care, Joint mobilization, Joint manipulation, Stair  training, Orthotic/Fit training, DME instructions, Aquatic Therapy, Dry Needling, Electrical stimulation, Cryotherapy, Moist heat, Taping, Ultrasound, Ionotophoresis 4mg /ml Dexamethasone, Manual therapy,  Vasopneumatic device, Traction, Spinal manipulation, Spinal mobilization,Balance training, Gait training,   PLAN FOR NEXT SESSION:    Sedalia Muta, PT, DPT 7:19 PM  01/07/23

## 2023-01-07 ENCOUNTER — Encounter: Payer: Self-pay | Admitting: Physical Therapy

## 2023-01-11 DIAGNOSIS — H402221 Chronic angle-closure glaucoma, left eye, mild stage: Secondary | ICD-10-CM | POA: Diagnosis not present

## 2023-01-11 DIAGNOSIS — H401131 Primary open-angle glaucoma, bilateral, mild stage: Secondary | ICD-10-CM | POA: Diagnosis not present

## 2023-01-11 DIAGNOSIS — H2512 Age-related nuclear cataract, left eye: Secondary | ICD-10-CM | POA: Diagnosis not present

## 2023-01-11 DIAGNOSIS — H02889 Meibomian gland dysfunction of unspecified eye, unspecified eyelid: Secondary | ICD-10-CM | POA: Diagnosis not present

## 2023-01-12 ENCOUNTER — Encounter: Payer: Self-pay | Admitting: Physical Therapy

## 2023-01-12 ENCOUNTER — Ambulatory Visit: Payer: Medicare PPO | Admitting: Physical Therapy

## 2023-01-12 DIAGNOSIS — M6281 Muscle weakness (generalized): Secondary | ICD-10-CM

## 2023-01-12 DIAGNOSIS — R2689 Other abnormalities of gait and mobility: Secondary | ICD-10-CM | POA: Diagnosis not present

## 2023-01-12 DIAGNOSIS — M5459 Other low back pain: Secondary | ICD-10-CM | POA: Diagnosis not present

## 2023-01-12 NOTE — Therapy (Signed)
OUTPATIENT PHYSICAL THERAPY LOWER EXTREMITY TREATMENT    Patient Name: Alexandra Dixon MRN: [id number] DOB:[birth year], 87 y.o., female Today's Date: 01/12/2023  END OF SESSION:  PT End of Session - 01/12/23 1342     Visit Number 2    Number of Visits 16    Date for PT Re-Evaluation 03/02/23    Authorization Type [employer]    PT Start Time 1345    PT Stop Time 1427    PT Time Calculation (min) 42 min    Equipment Utilized During Treatment Gait belt    Activity Tolerance Patient tolerated treatment well    Behavior During Therapy WFL for tasks assessed/performed             Past Medical History:  Diagnosis Date   Glaucoma    Heart murmur    Hyperlipemia    Hypothyroidism    Obesity    Sinus bradycardia    Systemic hypertension    Past Surgical History:  Procedure Laterality Date   NM MYOVIEW LTD  07/27/2006   low risk, cannot exclude subtle anteroapical ischemia   REPLACEMENT TOTAL KNEE  08/21/2006   left knee and right knee   US ECHOCARDIOGRAPHY  06/25/2007   mild LVH,LA mildly dilated,mild to mod mitral annular ca+,mild MR,mild to mod TR,AOV mildly sclerotic,aortic root sclerosis   Patient Active Problem List   Diagnosis Date Noted   First degree AV block 07/22/2015   Left carotid bruit 03/19/2014   Bradycardia 12/31/2012   Morbid obesity (HCC) 12/31/2012   Hyperlipidemia 12/31/2012   Glaucoma 12/31/2012   Essential hypertension 12/31/2012    PCP: Renaye Rakers  REFERRING PROVIDER: Renaye Rakers  REFERRING DIAG: Renaye Rakers  THERAPY DIAG:  Other low back pain  Muscle weakness (generalized)  Other abnormalities of gait and mobility  Rationale for Evaluation and Treatment: Rehabilitation  ONSET DATE:    SUBJECTIVE:   SUBJECTIVE STATEMENT: Pt states she slept on her back and her back was not as stiff in AM. Has been doing HEP.    Eval: Pt reports multiple areas of concern.  Back is stiff, low back. - main complaint.  Bil knee TKA 22, 23, Dr. Valentina Gu.   Did make good outcome.  [facility]  Bil feet injections lateral ankles.  Did help some  1 yr ago. Ankles feel weak, Feels R foot/Leg is weaker.  Tingling in arms and back- Arms : down both arms few minutes only/intermittently Has L and R trigger fingers.  Feels "tingling" in low back, 2x/wk for just a  couple minutes, then goes away.  Brought on when Sitting too long Feels balance is decreased  Pain in R groin.  No AD.  Lives alone  PERTINENT HISTORY: Bradycardia, HTN,  PAIN:  Are you having pain? Yes: NPRS scale: 4/10 Pain location: low back  Pain description: Stiffness Aggravating factors: sitting too long  Relieving factors: none stated   PRECAUTIONS: Fall  WEIGHT BEARING RESTRICTIONS: No  FALLS:  Has patient fallen in last 6 months? Yes. Number of falls 3  PLOF: Independent  PATIENT GOALS:  Decreased pain , improved strength, mobility   NEXT MD VISIT:   OBJECTIVE:   DIAGNOSTIC FINDINGS:   PATIENT SURVEYS:   COGNITION: Overall cognitive status: Within functional limits for tasks assessed     SENSATION: WFL  EDEMA:   POSTURE:    No Significant postural limitations  PALPATION:   LOWER EXTREMITY ROM:  Shoulder Flexion: 125 bil;  Knees:  0- 90  deg bil  Lumbar: mild limitation for ext and SB, Hips: mild limitation for rotation    LOWER EXTREMITY MMT:  Shoulder strength: 3-/5 for flex and abd  Knees: 4/5  Hips: 4-/5   LOWER EXTREMITY SPECIAL TESTS:    TUG:  13.35 sec    TODAY'S TREATMENT:                                                                                                                              DATE:   01/12/2023 Therapeutic Exercise: Aerobic: Supine:   pelvic tilts x 10;  clams GTB x 20;  SLR x 10 bil;  Seated:   pelvic tilts x 20- with education on mechanics.  Standing:   hip abd 2 x 10 bil;  Stretches:  LTR x 15;   SKTC 20 sec x 3 bil ,    Neuromuscular Re-education: Manual Therapy: Therapeutic  Activity: Self Care:   PATIENT EDUCATION:  Education details: updated and reviewed HEP Person educated: Patient Education method: Explanation, Demonstration, Tactile cues, Verbal cues, and Handouts Education comprehension: verbalized understanding, returned demonstration, verbal cues required, tactile cues required, and needs further education   HOME EXERCISE PROGRAM: Access Code: [email or web address] URL: [email or web address] Date: 01/05/2023 Prepared by: Sedalia Muta  Exercises - Supine Lower Trunk Rotation  - 1-2 x daily - 10 reps - 5 hold - Supine Posterior Pelvic Tilt  - 1-2 x daily - 1 sets - 10 reps - 3 hold - Hooklying Single Knee to Chest Stretch with Towel  - 1-2 x daily - 3 reps - 15-20 hold  ASSESSMENT:  CLINICAL IMPRESSION: 01/12/2023 Pt with good tolerance for activities today. She has no increased pain with ther ex. Reviewed HEP for optimal mechanics with performance. Challenged with pelvic tilts in seated position, but good ability in supine. Plan to progress mobility and strength as tolerated.   Eval: Patient presents with primary complaint of  stiffness and pain in low back. She has weakness in LEs and core, and has lack of effective HEP. She has decreased ability for full functional activities, ADLs, and IADLs, due to pain and deficit.  Pt will  benefit from skilled PT to improve deficits and pain and to return to PLOF. Focus will be on back pain, LE and core strength, and functional mobility/balance with gait and stairs.   OBJECTIVE IMPAIRMENTS: decreased activity tolerance, decreased mobility, decreased strength, increased muscle spasms, improper body mechanics, and pain.   ACTIVITY LIMITATIONS: carrying, lifting, standing, squatting, stairs, transfers, hygiene/grooming, and locomotion level  PARTICIPATION LIMITATIONS: meal prep, cleaning, laundry, shopping, and community activity  PERSONAL FACTORS: Past/current experiences and multiple pain locations   are  also affecting patient's functional outcome.   REHAB POTENTIAL: Good  CLINICAL DECISION MAKING: Stable/uncomplicated  EVALUATION COMPLEXITY: Low   GOALS: Goals reviewed with patient? Yes  SHORT TERM GOALS: Target date: 01/19/2023  Pt to be independent with initial HEP  Goal status: INITIAL    LONG TERM  GOALS: Target date: 03/02/2023  Pt to be independent with final HEP  Goal status: INITIAL  2.  Pt to demo ability for functional squat, bend, with mechanics WNL, to improve ability for IADLS and decreased pain pain.   Goal status: INITIAL  3.  Pt to demo improved strength of hips and core to be [facility] for pt age, to improve pain and function.   Goal status: INITIAL  4.  Pt to demo ability for gait and dynamic activity with balance WFL for pt age, to improve fall risk and ability for community activity.   Goal status: INITIAL   PLAN:  PT FREQUENCY: 1-2x/week  PT DURATION: 8 weeks  PLANNED INTERVENTIONS: Therapeutic exercises, Therapeutic activity, Neuromuscular re-education, Patient/Family education, Self Care, Joint mobilization, Joint manipulation, Stair training, Orthotic/Fit training, DME instructions, Aquatic Therapy, Dry Needling, Electrical stimulation, Cryotherapy, Moist heat, Taping, Ultrasound, Ionotophoresis 4mg /ml Dexamethasone, Manual therapy,  Vasopneumatic device, Traction, Spinal manipulation, Spinal mobilization,Balance training, Gait training,   PLAN FOR NEXT SESSION:    Sedalia Muta, PT, DPT 4:50 PM  01/12/23

## 2023-01-16 ENCOUNTER — Encounter: Payer: Self-pay | Admitting: Physical Therapy

## 2023-01-16 ENCOUNTER — Ambulatory Visit: Payer: Medicare PPO | Admitting: Physical Therapy

## 2023-01-16 DIAGNOSIS — R2689 Other abnormalities of gait and mobility: Secondary | ICD-10-CM

## 2023-01-16 DIAGNOSIS — M5459 Other low back pain: Secondary | ICD-10-CM | POA: Diagnosis not present

## 2023-01-16 DIAGNOSIS — M6281 Muscle weakness (generalized): Secondary | ICD-10-CM

## 2023-01-16 NOTE — Therapy (Signed)
OUTPATIENT PHYSICAL THERAPY LOWER EXTREMITY TREATMENT    Patient Name: Alexandra Dixon MRN: [id number] DOB:Oct 05, 1935, 87 y.o., female Today's Date: 01/16/2023  END OF SESSION:  PT End of Session - 01/16/23 1353     Visit Number 3    Number of Visits 16    Date for PT Re-Evaluation 03/02/23    Authorization Type [employer]    PT Start Time 1346    PT Stop Time 1429    PT Time Calculation (min) 43 min    Equipment Utilized During Treatment Gait belt    Activity Tolerance Patient tolerated treatment well    Behavior During Therapy WFL for tasks assessed/performed              Past Medical History:  Diagnosis Date   Glaucoma    Heart murmur    Hyperlipemia    Hypothyroidism    Obesity    Sinus bradycardia    Systemic hypertension    Past Surgical History:  Procedure Laterality Date   NM MYOVIEW LTD  07/27/2006   low risk, cannot exclude subtle anteroapical ischemia   REPLACEMENT TOTAL KNEE  08/21/2006   left knee and right knee   US ECHOCARDIOGRAPHY  06/25/2007   mild LVH,LA mildly dilated,mild to mod mitral annular ca+,mild MR,mild to mod TR,AOV mildly sclerotic,aortic root sclerosis   Patient Active Problem List   Diagnosis Date Noted   First degree AV block 07/22/2015   Left carotid bruit 03/19/2014   Bradycardia 12/31/2012   Morbid obesity (HCC) 12/31/2012   Hyperlipidemia 12/31/2012   Glaucoma 12/31/2012   Essential hypertension 12/31/2012    PCP: Renaye Rakers  REFERRING PROVIDER: Renaye Rakers  REFERRING DIAG: Renaye Rakers  THERAPY DIAG:  Other low back pain  Muscle weakness (generalized)  Other abnormalities of gait and mobility  Rationale for Evaluation and Treatment: Rehabilitation  ONSET DATE:    SUBJECTIVE:   SUBJECTIVE STATEMENT: Pt states more stiffness on Saturday. Tried to do some of the exercises.    Eval: Pt reports multiple areas of concern.  Back is stiff, low back. - main complaint.  Bil knee TKA 22, 23, Dr. Valentina Gu.  Did make good  outcome.  [facility], [facility]?  Bil feet injections lateral ankles.  Did help some  1 yr ago. Ankles feel weak, Feels R foot/Leg is weaker.  Tingling in arms and back- Arms : down both arms few minutes only/intermittently Has L and R trigger fingers.  Feels "tingling" in low back, 2x/wk for just a  couple minutes, then goes away.  Brought on when Sitting too long Feels balance is decreased  Pain in R groin.  No AD.  Lives alone  PERTINENT HISTORY: Bradycardia, HTN,  PAIN:  Are you having pain? Yes: NPRS scale: 4/10 Pain location: low back  Pain description: Stiffness Aggravating factors: sitting too long  Relieving factors: none stated   PRECAUTIONS: Fall  WEIGHT BEARING RESTRICTIONS: No  FALLS:  Has patient fallen in last 6 months? Yes. Number of falls 3  PLOF: Independent  PATIENT GOALS:  Decreased pain , improved strength, mobility   NEXT MD VISIT:   OBJECTIVE:   DIAGNOSTIC FINDINGS:   PATIENT SURVEYS:   COGNITION: Overall cognitive status: Within functional limits for tasks assessed     SENSATION: WFL  EDEMA:   POSTURE:    No Significant postural limitations  PALPATION:   LOWER EXTREMITY ROM:  Shoulder Flexion: 125 bil;  Knees:  0- 90 deg bil  Lumbar: mild limitation  for ext and SB, Hips: mild limitation for rotation    LOWER EXTREMITY MMT:  Shoulder strength: 3-/5 for flex and abd  Knees: 4/5  Hips: 4-/5   LOWER EXTREMITY SPECIAL TESTS:    TUG:  13.35 sec    TODAY'S TREATMENT:                                                                                                                              DATE:   01/16/2023 Therapeutic Exercise: Aerobic: Supine:   pelvic tilts x 10;  clams with TA GTB x 20;    SLR with TA,  x 10  bil;  Seated:   seated HR 2 x 10;  Standing:   hip abd 2 x 10 bil; Marching x 10;  Scap squeeze 2 x 10;  ambulation with cueing for upright posture 45 ft x 6;  Stretches:  LTR x 15;   SKTC 20 sec x 3 bil ,    Neuromuscular Re-education:   Manual Therapy: Therapeutic Activity: Self Care:    Therapeutic Exercise: Aerobic: Supine:   pelvic tilts x 10;  clams GTB x 20;  SLR x 10 bil;  Seated:   pelvic tilts x 20- with education on mechanics.  Standing:   hip abd 2 x 10 bil;  Stretches:  LTR x 15;   SKTC 20 sec x 3 bil ,    Neuromuscular Re-education:    Manual Therapy: Therapeutic Activity: Self Care:   PATIENT EDUCATION:  Education details: reviewed HEP Person educated: Patient Education method: Explanation, Demonstration, Tactile cues, Verbal cues, and Handouts Education comprehension: verbalized understanding, returned demonstration, verbal cues required, tactile cues required, and needs further education   HOME EXERCISE PROGRAM: Access Code: [email or web address]   ASSESSMENT:  CLINICAL IMPRESSION: 01/16/2023 Pt with good tolerance for activities today. She has little pain in low back today with activity. She requires cuing for upright posture with ambulation. Updated HEP today, will benefit from continued work on strength, and standing stability/balance with functional activity.   Eval: Patient presents with primary complaint of  stiffness and pain in low back. She has weakness in LEs and core, and has lack of effective HEP. She has decreased ability for full functional activities, ADLs, and IADLs, due to pain and deficit.  Pt will  benefit from skilled PT to improve deficits and pain and to return to PLOF. Focus will be on back pain, LE and core strength, and functional mobility/balance with gait and stairs.   OBJECTIVE IMPAIRMENTS: decreased activity tolerance, decreased mobility, decreased strength, increased muscle spasms, improper body mechanics, and pain.   ACTIVITY LIMITATIONS: carrying, lifting, standing, squatting, stairs, transfers, hygiene/grooming, and locomotion level  PARTICIPATION LIMITATIONS: meal prep, cleaning, laundry, shopping, and community activity  PERSONAL FACTORS:  Past/current experiences and multiple pain locations   are also affecting patient's functional outcome.   REHAB POTENTIAL: Good  CLINICAL DECISION MAKING: Stable/uncomplicated  EVALUATION COMPLEXITY: Low   GOALS: Goals reviewed  with patient? Yes  SHORT TERM GOALS: Target date: 01/19/2023  Pt to be independent with initial HEP  Goal status: INITIAL    LONG TERM GOALS: Target date: 03/02/2023  Pt to be independent with final HEP  Goal status: INITIAL  2.  Pt to demo ability for functional squat, bend, with mechanics WNL, to improve ability for IADLS and decreased pain pain.   Goal status: INITIAL  3.  Pt to demo improved strength of hips and core to be [facility] for pt age, to improve pain and function.   Goal status: INITIAL  4.  Pt to demo ability for gait and dynamic activity with balance WFL for pt age, to improve fall risk and ability for community activity.   Goal status: INITIAL   PLAN:  PT FREQUENCY: 1-2x/week  PT DURATION: 8 weeks  PLANNED INTERVENTIONS: Therapeutic exercises, Therapeutic activity, Neuromuscular re-education, Patient/Family education, Self Care, Joint mobilization, Joint manipulation, Stair training, Orthotic/Fit training, DME instructions, Aquatic Therapy, Dry Needling, Electrical stimulation, Cryotherapy, Moist heat, Taping, Ultrasound, Ionotophoresis 4mg /ml Dexamethasone, Manual therapy,  Vasopneumatic device, Traction, Spinal manipulation, Spinal mobilization,Balance training, Gait training,   PLAN FOR NEXT SESSION: hip strength, LE strength, standing balance, functional strength, stairs, posture with walking.    Sedalia Muta, PT, DPT 3:30 PM  01/16/23

## 2023-01-19 ENCOUNTER — Encounter: Payer: Self-pay | Admitting: Physical Therapy

## 2023-01-19 ENCOUNTER — Ambulatory Visit: Payer: Medicare PPO | Admitting: Physical Therapy

## 2023-01-19 DIAGNOSIS — R2689 Other abnormalities of gait and mobility: Secondary | ICD-10-CM

## 2023-01-19 DIAGNOSIS — M5459 Other low back pain: Secondary | ICD-10-CM | POA: Diagnosis not present

## 2023-01-19 DIAGNOSIS — M6281 Muscle weakness (generalized): Secondary | ICD-10-CM

## 2023-01-19 NOTE — Therapy (Signed)
OUTPATIENT PHYSICAL THERAPY LOWER EXTREMITY TREATMENT    Patient Name: Alexandra Dixon MRN: [id number] DOB:[birth year], 87 y.o., female Today's Date: 01/19/2023  END OF SESSION:  PT End of Session - 01/19/23 1348     Visit Number 4    Number of Visits 16    Date for PT Re-Evaluation 03/02/23    Authorization Type [employer]    PT Start Time 1350    PT Stop Time 1430    PT Time Calculation (min) 40 min    Equipment Utilized During Treatment Gait belt    Activity Tolerance Patient tolerated treatment well    Behavior During Therapy WFL for tasks assessed/performed              Past Medical History:  Diagnosis Date   Glaucoma    Heart murmur    Hyperlipemia    Hypothyroidism    Obesity    Sinus bradycardia    Systemic hypertension    Past Surgical History:  Procedure Laterality Date   NM MYOVIEW LTD  07/27/2006   low risk, cannot exclude subtle anteroapical ischemia   REPLACEMENT TOTAL KNEE  08/21/2006   left knee and right knee   US ECHOCARDIOGRAPHY  06/25/2007   mild LVH,LA mildly dilated,mild to mod mitral annular ca+,mild MR,mild to mod TR,AOV mildly sclerotic,aortic root sclerosis   Patient Active Problem List   Diagnosis Date Noted   First degree AV block 07/22/2015   Left carotid bruit 03/19/2014   Bradycardia 12/31/2012   Morbid obesity (HCC) 12/31/2012   Hyperlipidemia 12/31/2012   Glaucoma 12/31/2012   Essential hypertension 12/31/2012    PCP: Renaye Rakers  REFERRING PROVIDER: Renaye Rakers  REFERRING DIAG: Renaye Rakers  THERAPY DIAG:  Other low back pain  Muscle weakness (generalized)  Other abnormalities of gait and mobility  Rationale for Evaluation and Treatment: Rehabilitation  ONSET DATE:    SUBJECTIVE:   SUBJECTIVE STATEMENT: Pt states doing well. Doing well with HEP.    Eval: Pt reports multiple areas of concern.  Back is stiff, low back. - main complaint.  Bil knee TKA 22, 23, Dr. Valentina Gu.  Did make good outcome.  [facility]  Bil feet injections lateral ankles.  Did help some  1 yr ago. Ankles feel weak, Feels R foot/Leg is weaker.  Tingling in arms and back- Arms : down both arms few minutes only/intermittently Has L and R trigger fingers.  Feels "tingling" in low back, 2x/wk for just a  couple minutes, then goes away.  Brought on when Sitting too long Feels balance is decreased  Pain in R groin.  No AD.  Lives alone  PERTINENT HISTORY: Bradycardia, HTN,  PAIN:  Are you having pain? Yes: NPRS scale: 4/10 Pain location: low back  Pain description: Stiffness Aggravating factors: sitting too long  Relieving factors: none stated   PRECAUTIONS: Fall  WEIGHT BEARING RESTRICTIONS: No  FALLS:  Has patient fallen in last 6 months? Yes. Number of falls 3  PLOF: Independent  PATIENT GOALS:  Decreased pain , improved strength, mobility   NEXT MD VISIT:   OBJECTIVE:   DIAGNOSTIC FINDINGS:   PATIENT SURVEYS:   COGNITION: Overall cognitive status: Within functional limits for tasks assessed     SENSATION: WFL  EDEMA:   POSTURE:    No Significant postural limitations  PALPATION:   LOWER EXTREMITY ROM:  Shoulder Flexion: 125 bil;  Knees:  0- 90 deg bil  Lumbar: mild limitation for ext and SB, Hips:  mild limitation for rotation    LOWER EXTREMITY MMT:  Shoulder strength: 3-/5 for flex and abd  Knees: 4/5  Hips: 4-/5   LOWER EXTREMITY SPECIAL TESTS:   TUG:  13.35 sec    TODAY'S TREATMENT:                                                                                                                              DATE:   01/19/2023 Therapeutic Exercise: Aerobic: trial for bike, can not get R knee/LE around.  Supine:   Seated:    sit to stands 2 x 5 no UE support;  Standing:   hip abd and ext 2 x 10 bil;  Marching x 10;  Scap squeeze 2 x 10;  Rows x 15;  ambulation with cueing for upright posture 45 ft x 6;  Step ups 4 in x 10 bil, with 1 UE supports, up/down 4 in  stairs x 4, with 2 hand rails.  Stretches:     Neuromuscular Re-education:   Manual Therapy: Therapeutic Activity: Self Care:    Therapeutic Exercise: Aerobic: Supine:   pelvic tilts x 10;  clams GTB x 20;  SLR x 10 bil;  Seated:   pelvic tilts x 20- with education on mechanics.  Standing:   hip abd 2 x 10 bil;  Stretches:  LTR x 15;   SKTC 20 sec x 3 bil ,    Neuromuscular Re-education:    Manual Therapy: Therapeutic Activity: Self Care:   PATIENT EDUCATION:  Education details: reviewed HEP Person educated: Patient Education method: Explanation, Demonstration, Tactile cues, Verbal cues, and Handouts Education comprehension: verbalized understanding, returned demonstration, verbal cues required, tactile cues required, and needs further education   HOME EXERCISE PROGRAM: Access Code: [email or web address]   ASSESSMENT:  CLINICAL IMPRESSION: 01/19/2023 Trial for bike today, pt unable to get R knee around, due to stiffness in R knee. She will re-try hers at home to see if she can perform there. Pt with improving tolerance for standing ther ex. She has safe ability for 4 in stairs today with hand rails, but will benefit from continued practice for performing 6 in steps. Pt with improving ability for upright posture with standing ther ex but continues to require cuing for when she is walking.    Eval: Patient presents with primary complaint of  stiffness and pain in low back. She has weakness in LEs and core, and has lack of effective HEP. She has decreased ability for full functional activities, ADLs, and IADLs, due to pain and deficit.  Pt will  benefit from skilled PT to improve deficits and pain and to return to PLOF. Focus will be on back pain, LE and core strength, and functional mobility/balance with gait and stairs.   OBJECTIVE IMPAIRMENTS: decreased activity tolerance, decreased mobility, decreased strength, increased muscle spasms, improper body mechanics, and pain.   ACTIVITY  LIMITATIONS: carrying, lifting, standing, squatting, stairs, transfers, hygiene/grooming, and locomotion level  PARTICIPATION LIMITATIONS: meal prep, cleaning, laundry, shopping, and community activity  PERSONAL FACTORS: Past/current experiences and multiple pain locations   are also affecting patient's functional outcome.   REHAB POTENTIAL: Good  CLINICAL DECISION MAKING: Stable/uncomplicated  EVALUATION COMPLEXITY: Low   GOALS: Goals reviewed with patient? Yes  SHORT TERM GOALS: Target date: 01/19/2023  Pt to be independent with initial HEP  Goal status: INITIAL    LONG TERM GOALS: Target date: 03/02/2023  Pt to be independent with final HEP  Goal status: INITIAL  2.  Pt to demo ability for functional squat, bend, with mechanics WNL, to improve ability for IADLS and decreased pain pain.   Goal status: INITIAL  3.  Pt to demo improved strength of hips and core to be [facility] for pt age, to improve pain and function.   Goal status: INITIAL  4.  Pt to demo ability for gait and dynamic activity with balance WFL for pt age, to improve fall risk and ability for community activity.   Goal status: INITIAL   PLAN:  PT FREQUENCY: 1-2x/week  PT DURATION: 8 weeks  PLANNED INTERVENTIONS: Therapeutic exercises, Therapeutic activity, Neuromuscular re-education, Patient/Family education, Self Care, Joint mobilization, Joint manipulation, Stair training, Orthotic/Fit training, DME instructions, Aquatic Therapy, Dry Needling, Electrical stimulation, Cryotherapy, Moist heat, Taping, Ultrasound, Ionotophoresis 4mg /ml Dexamethasone, Manual therapy,  Vasopneumatic device, Traction, Spinal manipulation, Spinal mobilization,Balance training, Gait training,   PLAN FOR NEXT SESSION: hip strength, LE strength, standing balance, functional strength, stairs, posture with walking.    Sedalia Muta, PT, DPT 1:49 PM  01/19/23

## 2023-01-20 DIAGNOSIS — M65332 Trigger finger, left middle finger: Secondary | ICD-10-CM | POA: Diagnosis not present

## 2023-01-23 ENCOUNTER — Encounter: Payer: Self-pay | Admitting: Physical Therapy

## 2023-01-23 ENCOUNTER — Ambulatory Visit: Payer: Medicare PPO | Admitting: Physical Therapy

## 2023-01-23 DIAGNOSIS — R2689 Other abnormalities of gait and mobility: Secondary | ICD-10-CM | POA: Diagnosis not present

## 2023-01-23 DIAGNOSIS — M6281 Muscle weakness (generalized): Secondary | ICD-10-CM

## 2023-01-23 DIAGNOSIS — M5459 Other low back pain: Secondary | ICD-10-CM

## 2023-01-23 NOTE — Therapy (Signed)
OUTPATIENT PHYSICAL THERAPY LOWER EXTREMITY TREATMENT    Patient Name: SABRIYA NORA MRN: [id number] DOB:13-Mar-1936, 87 y.o., female Today's Date: 01/23/2023  END OF SESSION:  PT End of Session - 01/23/23 1102     Visit Number 5    Number of Visits 16    Date for PT Re-Evaluation 03/02/23    Authorization Type [employer]    PT Start Time 1104    PT Stop Time 1145    PT Time Calculation (min) 41 min    Equipment Utilized During Treatment Gait belt    Activity Tolerance Patient tolerated treatment well    Behavior During Therapy WFL for tasks assessed/performed              Past Medical History:  Diagnosis Date   Glaucoma    Heart murmur    Hyperlipemia    Hypothyroidism    Obesity    Sinus bradycardia    Systemic hypertension    Past Surgical History:  Procedure Laterality Date   NM MYOVIEW LTD  07/27/2006   low risk, cannot exclude subtle anteroapical ischemia   REPLACEMENT TOTAL KNEE  08/21/2006   left knee and right knee   US ECHOCARDIOGRAPHY  06/25/2007   mild LVH,LA mildly dilated,mild to mod mitral annular ca+,mild MR,mild to mod TR,AOV mildly sclerotic,aortic root sclerosis   Patient Active Problem List   Diagnosis Date Noted   First degree AV block 07/22/2015   Left carotid bruit 03/19/2014   Bradycardia 12/31/2012   Morbid obesity (HCC) 12/31/2012   Hyperlipidemia 12/31/2012   Glaucoma 12/31/2012   Essential hypertension 12/31/2012    PCP: Renaye Rakers  REFERRING PROVIDER: Renaye Rakers  REFERRING DIAG: Renaye Rakers  THERAPY DIAG:  Other low back pain  Muscle weakness (generalized)  Other abnormalities of gait and mobility  Rationale for Evaluation and Treatment: Rehabilitation  ONSET DATE:    SUBJECTIVE:   SUBJECTIVE STATEMENT: Pt states doing well. Feels a little stiff. Doing well with HEP.    Eval: Pt reports multiple areas of concern.  Back is stiff, low back. - main complaint.  Bil knee TKA 22, 23, Dr. Valentina Gu.  Did make good outcome.   [facility]  Bil feet injections lateral ankles.  Did help some  1 yr ago. Ankles feel weak, Feels R foot/Leg is weaker.  Tingling in arms and back- Arms : down both arms few minutes only/intermittently Has L and R trigger fingers.  Feels "tingling" in low back, 2x/wk for just a  couple minutes, then goes away.  Brought on when Sitting too long Feels balance is decreased  Pain in R groin.  No AD.  Lives alone  PERTINENT HISTORY: Bradycardia, HTN,  PAIN:  Are you having pain? Yes: NPRS scale: 4/10 Pain location: low back  Pain description: Stiffness Aggravating factors: sitting too long  Relieving factors: none stated   PRECAUTIONS: Fall  WEIGHT BEARING RESTRICTIONS: No  FALLS:  Has patient fallen in last 6 months? Yes. Number of falls 3  PLOF: Independent  PATIENT GOALS:  Decreased pain , improved strength, mobility   NEXT MD VISIT:   OBJECTIVE:   DIAGNOSTIC FINDINGS:   PATIENT SURVEYS:   COGNITION: Overall cognitive status: Within functional limits for tasks assessed     SENSATION: WFL  EDEMA:   POSTURE:    No Significant postural limitations  PALPATION:   LOWER EXTREMITY ROM:  Shoulder Flexion: 125 bil;  Knees:  0- 90 deg bil  Lumbar: mild limitation for  ext and SB, Hips: mild limitation for rotation    LOWER EXTREMITY MMT:  Shoulder strength: 3-/5 for flex and abd  Knees: 4/5  Hips: 4-/5   LOWER EXTREMITY SPECIAL TESTS:   TUG:  13.35 sec    TODAY'S TREATMENT:                                                                                                                              DATE:   01/23/2023 Therapeutic Exercise: Aerobic:   Supine:   Seated:    sit to stands , regular chair, 3x 5 no UE support; then discussed using light UE support, but not over tensing neck and shoulders.  Standing:   hip abd and ext 2 x 10 bil;  Marching 2 x 10;  Scap squeeze 2 x 10;  Rows ( at mat table ) RTB  x 15;  Stretches:      Neuromuscular Re-education:   Step ups 6 in x 10 bil, with 1 UE support on L, 2 UE support on R;   up/down 5 stairs x 3, with 2 hand rails.  fwd and lateral steps with weight shifts x 10 ea bil;  Manual Therapy: Therapeutic Activity: Self Care:    Therapeutic Exercise: Aerobic: Supine:   pelvic tilts x 10;  clams GTB x 20;  SLR x 10 bil;  Seated:   pelvic tilts x 20- with education on mechanics.  Standing:   hip abd 2 x 10 bil;  Stretches:  LTR x 15;   SKTC 20 sec x 3 bil ,    Neuromuscular Re-education:    Manual Therapy: Therapeutic Activity: Self Care:   PATIENT EDUCATION:  Education details: reviewed HEP Person educated: Patient Education method: Explanation, Demonstration, Tactile cues, Verbal cues, and Handouts Education comprehension: verbalized understanding, returned demonstration, verbal cues required, tactile cues required, and needs further education   HOME EXERCISE PROGRAM: Access Code: [email or web address]   ASSESSMENT:  CLINICAL IMPRESSION: 01/23/2023  Pt with improved trunk position and posture with standing activities today. She has improved ability for 6 in step up on L, using only 1 UE support, but requires 2 UE support for R LE step ups. Will continue to benefit from strength and stability.    Eval: Patient presents with primary complaint of  stiffness and pain in low back. She has weakness in LEs and core, and has lack of effective HEP. She has decreased ability for full functional activities, ADLs, and IADLs, due to pain and deficit.  Pt will  benefit from skilled PT to improve deficits and pain and to return to PLOF. Focus will be on back pain, LE and core strength, and functional mobility/balance with gait and stairs.   OBJECTIVE IMPAIRMENTS: decreased activity tolerance, decreased mobility, decreased strength, increased muscle spasms, improper body mechanics, and pain.   ACTIVITY LIMITATIONS: carrying, lifting, standing, squatting, stairs, transfers,  hygiene/grooming, and locomotion level  PARTICIPATION LIMITATIONS: meal prep, cleaning, laundry, shopping, and  community activity  PERSONAL FACTORS: Past/current experiences and multiple pain locations   are also affecting patient's functional outcome.   REHAB POTENTIAL: Good  CLINICAL DECISION MAKING: Stable/uncomplicated  EVALUATION COMPLEXITY: Low   GOALS: Goals reviewed with patient? Yes  SHORT TERM GOALS: Target date: 01/19/2023  Pt to be independent with initial HEP  Goal status: INITIAL    LONG TERM GOALS: Target date: 03/02/2023  Pt to be independent with final HEP  Goal status: INITIAL  2.  Pt to demo ability for functional squat, bend, with mechanics WNL, to improve ability for IADLS and decreased pain pain.   Goal status: INITIAL  3.  Pt to demo improved strength of hips and core to be [facility] for pt age, to improve pain and function.   Goal status: INITIAL  4.  Pt to demo ability for gait and dynamic activity with balance WFL for pt age, to improve fall risk and ability for community activity.   Goal status: INITIAL   PLAN:  PT FREQUENCY: 1-2x/week  PT DURATION: 8 weeks  PLANNED INTERVENTIONS: Therapeutic exercises, Therapeutic activity, Neuromuscular re-education, Patient/Family education, Self Care, Joint mobilization, Joint manipulation, Stair training, Orthotic/Fit training, DME instructions, Aquatic Therapy, Dry Needling, Electrical stimulation, Cryotherapy, Moist heat, Taping, Ultrasound, Ionotophoresis 4mg /ml Dexamethasone, Manual therapy,  Vasopneumatic device, Traction, Spinal manipulation, Spinal mobilization,Balance training, Gait training,   PLAN FOR NEXT SESSION: hip strength, LE strength, standing balance, functional strength, stairs, posture with walking.    Sedalia Muta, PT, DPT 12:02 PM  01/23/23

## 2023-01-26 ENCOUNTER — Encounter: Payer: Medicare PPO | Admitting: Physical Therapy

## 2023-01-30 ENCOUNTER — Encounter: Payer: Self-pay | Admitting: Physical Therapy

## 2023-01-30 ENCOUNTER — Ambulatory Visit: Payer: Medicare PPO | Admitting: Physical Therapy

## 2023-01-30 DIAGNOSIS — M6281 Muscle weakness (generalized): Secondary | ICD-10-CM | POA: Diagnosis not present

## 2023-01-30 DIAGNOSIS — R2689 Other abnormalities of gait and mobility: Secondary | ICD-10-CM | POA: Diagnosis not present

## 2023-01-30 DIAGNOSIS — M5459 Other low back pain: Secondary | ICD-10-CM

## 2023-01-30 NOTE — Therapy (Signed)
OUTPATIENT PHYSICAL THERAPY LOWER EXTREMITY TREATMENT    Patient Name: Alexandra Dixon MRN: [id number] DOB:09-12-1935, 87 y.o., female Today's Date: 01/30/2023  END OF SESSION:  PT End of Session - 01/30/23 1059     Visit Number 6    Number of Visits 16    Date for PT Re-Evaluation 03/02/23    Authorization Type [employer]    PT Start Time 1100    PT Stop Time 1145    PT Time Calculation (min) 45 min    Equipment Utilized During Treatment Gait belt    Activity Tolerance Patient tolerated treatment well    Behavior During Therapy WFL for tasks assessed/performed              Past Medical History:  Diagnosis Date   Glaucoma    Heart murmur    Hyperlipemia    Hypothyroidism    Obesity    Sinus bradycardia    Systemic hypertension    Past Surgical History:  Procedure Laterality Date   NM MYOVIEW LTD  07/27/2006   low risk, cannot exclude subtle anteroapical ischemia   REPLACEMENT TOTAL KNEE  08/21/2006   left knee and right knee   US ECHOCARDIOGRAPHY  06/25/2007   mild LVH,LA mildly dilated,mild to mod mitral annular ca+,mild MR,mild to mod TR,AOV mildly sclerotic,aortic root sclerosis   Patient Active Problem List   Diagnosis Date Noted   First degree AV block 07/22/2015   Left carotid bruit 03/19/2014   Bradycardia 12/31/2012   Morbid obesity (HCC) 12/31/2012   Hyperlipidemia 12/31/2012   Glaucoma 12/31/2012   Essential hypertension 12/31/2012    PCP: Renaye Rakers  REFERRING PROVIDER: Renaye Rakers  REFERRING DIAG: Renaye Rakers  THERAPY DIAG:  Other low back pain  Muscle weakness (generalized)  Other abnormalities of gait and mobility  Rationale for Evaluation and Treatment: Rehabilitation  ONSET DATE:    SUBJECTIVE:   SUBJECTIVE STATEMENT: Pt states doing well. Notes that back is feeling less stiff. Her R leg still feels weak to her.    Eval: Pt reports multiple areas of concern.  Back is stiff, low back. - main complaint.  Bil knee TKA 22, 23,  Dr. Valentina Gu.  Did make good outcome.  [facility], [facility]?  Bil feet injections lateral ankles.  Did help some  1 yr ago. Ankles feel weak, Feels R foot/Leg is weaker.  Tingling in arms and back- Arms : down both arms few minutes only/intermittently Has L and R trigger fingers.  Feels "tingling" in low back, 2x/wk for just a  couple minutes, then goes away.  Brought on when Sitting too long Feels balance is decreased  Pain in R groin.  No AD.  Lives alone  PERTINENT HISTORY: Bradycardia, HTN,  PAIN:  Are you having pain? Yes: NPRS scale: 4/10 Pain location: low back  Pain description: Stiffness Aggravating factors: sitting too long  Relieving factors: none stated   PRECAUTIONS: Fall  WEIGHT BEARING RESTRICTIONS: No  FALLS:  Has patient fallen in last 6 months? Yes. Number of falls 3  PLOF: Independent  PATIENT GOALS:  Decreased pain , improved strength, mobility   NEXT MD VISIT:   OBJECTIVE:   DIAGNOSTIC FINDINGS:   PATIENT SURVEYS:   COGNITION: Overall cognitive status: Within functional limits for tasks assessed     SENSATION: WFL  EDEMA:   POSTURE:    No Significant postural limitations  PALPATION:   LOWER EXTREMITY ROM:  Shoulder Flexion: 125 bil;  Knees:  0- 90  deg bil  Lumbar: mild limitation for ext and SB, Hips: mild limitation for rotation    LOWER EXTREMITY MMT:  Shoulder strength: 3-/5 for flex and abd  Knees: 4/5  Hips: 4-/5   LOWER EXTREMITY SPECIAL TESTS:   TUG:  13.35 sec    TODAY'S TREATMENT:                                                                                                                              DATE:   01/30/2023 Therapeutic Exercise: Aerobic:   Supine:  reviewed SLR for HEP x 10 bil;  Seated:   sit to stands , regular chair, 3x 5 no UE support;   LAQ 3 lb x 15 bil;   HSC, GTB x 15 bil;  Standing:   hip abd and ext 2 x 10 bil;  Marching 2 x 10; Scap squeeze 1 x 10 for posture;   Rows(standing at  chair) GTB x 15;  Step ups 6 in x 5 bil, with 1 UE support on L,  2 UE support on R; 4 in step up with 1 UE support x 12 on R;  up/down 5 stairs x 4, with 2 hand rails.   staggered stance weight shifts with light UE support at counter x 15 bil;  Stretches:     Neuromuscular Re-education:   Manual Therapy: Therapeutic Activity: Self Care:    Therapeutic Exercise: Aerobic: Supine:   pelvic tilts x 10;  clams GTB x 20;  SLR x 10 bil;  Seated:   pelvic tilts x 20- with education on mechanics.  Standing:   hip abd 2 x 10 bil;  Stretches:  LTR x 15;   SKTC 20 sec x 3 bil ,    Neuromuscular Re-education:    Manual Therapy: Therapeutic Activity: Self Care:   PATIENT EDUCATION:  Education details: reviewed HEP Person educated: Patient Education method: Explanation, Demonstration, Tactile cues, Verbal cues, and Handouts Education comprehension: verbalized understanding, returned demonstration, verbal cues required, tactile cues required, and needs further education   HOME EXERCISE PROGRAM: Access Code: [email or web address]   ASSESSMENT:  CLINICAL IMPRESSION: 01/30/2023  Pt with improved ability for 4 in step up with less UE support. She still has difficulty with 6 in, with R leg. Reviewed more R knee strength today, and added to HEP. Her lack of flexion ROM is limiting on stairs. She requires less cuing for upright posture with ambulation and with standing ther ex. Pt to benefit from continued care with progressive strengthening.   Eval: Patient presents with primary complaint of  stiffness and pain in low back. She has weakness in LEs and core, and has lack of effective HEP. She has decreased ability for full functional activities, ADLs, and IADLs, due to pain and deficit.  Pt will  benefit from skilled PT to improve deficits and pain and to return to PLOF. Focus will be on back pain, LE and core strength, and functional mobility/balance  with gait and stairs.   OBJECTIVE IMPAIRMENTS: decreased  activity tolerance, decreased mobility, decreased strength, increased muscle spasms, improper body mechanics, and pain.   ACTIVITY LIMITATIONS: carrying, lifting, standing, squatting, stairs, transfers, hygiene/grooming, and locomotion level  PARTICIPATION LIMITATIONS: meal prep, cleaning, laundry, shopping, and community activity  PERSONAL FACTORS: Past/current experiences and multiple pain locations   are also affecting patient's functional outcome.   REHAB POTENTIAL: Good  CLINICAL DECISION MAKING: Stable/uncomplicated  EVALUATION COMPLEXITY: Low   GOALS: Goals reviewed with patient? Yes  SHORT TERM GOALS: Target date: 01/19/2023  Pt to be independent with initial HEP  Goal status: INITIAL    LONG TERM GOALS: Target date: 03/02/2023  Pt to be independent with final HEP  Goal status: INITIAL  2.  Pt to demo ability for functional squat, bend, with mechanics WNL, to improve ability for IADLS and decreased pain pain.   Goal status: INITIAL  3.  Pt to demo improved strength of hips and core to be [facility] for pt age, to improve pain and function.   Goal status: INITIAL  4.  Pt to demo ability for gait and dynamic activity with balance WFL for pt age, to improve fall risk and ability for community activity.   Goal status: INITIAL   PLAN:  PT FREQUENCY: 1-2x/week  PT DURATION: 8 weeks  PLANNED INTERVENTIONS: Therapeutic exercises, Therapeutic activity, Neuromuscular re-education, Patient/Family education, Self Care, Joint mobilization, Joint manipulation, Stair training, Orthotic/Fit training, DME instructions, Aquatic Therapy, Dry Needling, Electrical stimulation, Cryotherapy, Moist heat, Taping, Ultrasound, Ionotophoresis 4mg /ml Dexamethasone, Manual therapy,  Vasopneumatic device, Traction, Spinal manipulation, Spinal mobilization,Balance training, Gait training,   PLAN FOR NEXT SESSION: hip strength, LE strength, standing balance, functional strength, stairs, posture  with walking.    Sedalia Muta, PT, DPT 11:50 AM  01/30/23

## 2023-02-01 ENCOUNTER — Encounter: Payer: Self-pay | Admitting: Physical Therapy

## 2023-02-01 ENCOUNTER — Ambulatory Visit: Payer: Medicare PPO | Admitting: Physical Therapy

## 2023-02-01 DIAGNOSIS — M5459 Other low back pain: Secondary | ICD-10-CM | POA: Diagnosis not present

## 2023-02-01 DIAGNOSIS — M6281 Muscle weakness (generalized): Secondary | ICD-10-CM

## 2023-02-01 DIAGNOSIS — R2689 Other abnormalities of gait and mobility: Secondary | ICD-10-CM

## 2023-02-01 NOTE — Therapy (Signed)
OUTPATIENT PHYSICAL THERAPY LOWER EXTREMITY TREATMENT    Patient Name: Alexandra Dixon MRN: [id number] DOB:July 29, 1935, 87 y.o., female Today's Date: 02/01/2023  END OF SESSION:  PT End of Session - 02/01/23 1302     Visit Number 7    Number of Visits 16    Date for PT Re-Evaluation 03/02/23    Authorization Type [employer]    PT Start Time 1303    PT Stop Time 1341    PT Time Calculation (min) 38 min    Activity Tolerance Patient tolerated treatment well    Behavior During Therapy WFL for tasks assessed/performed               Past Medical History:  Diagnosis Date   Glaucoma    Heart murmur    Hyperlipemia    Hypothyroidism    Obesity    Sinus bradycardia    Systemic hypertension    Past Surgical History:  Procedure Laterality Date   NM MYOVIEW LTD  07/27/2006   low risk, cannot exclude subtle anteroapical ischemia   REPLACEMENT TOTAL KNEE  08/21/2006   left knee and right knee   US ECHOCARDIOGRAPHY  06/25/2007   mild LVH,LA mildly dilated,mild to mod mitral annular ca+,mild MR,mild to mod TR,AOV mildly sclerotic,aortic root sclerosis   Patient Active Problem List   Diagnosis Date Noted   First degree AV block 07/22/2015   Left carotid bruit 03/19/2014   Bradycardia 12/31/2012   Morbid obesity (HCC) 12/31/2012   Hyperlipidemia 12/31/2012   Glaucoma 12/31/2012   Essential hypertension 12/31/2012    PCP: Alexandra Dixon  REFERRING PROVIDER: Renaye Dixon  REFERRING DIAG: Alexandra Dixon  THERAPY DIAG:  Other low back pain  Muscle weakness (generalized)  Other abnormalities of gait and mobility  Rationale for Evaluation and Treatment: Rehabilitation  ONSET DATE:    SUBJECTIVE:   SUBJECTIVE STATEMENT:   I'm feeling a little stiff today but that's nothing unusual. No falls or close calls since last time, HEP is going well but have had a lot of visitors coming without telling me which has got me a bit off track.   Eval: Pt reports multiple areas of concern.   Back is stiff, low back. - main complaint.  Bil knee TKA 22, 23, Dr. Valentina Dixon.  Did make good outcome.  [facility], [facility]?  Bil feet injections lateral ankles.  Did help some  1 yr ago. Ankles feel weak, Feels R foot/Leg is weaker.  Tingling in arms and back- Arms : down both arms few minutes only/intermittently Has L and R trigger fingers.  Feels "tingling" in low back, 2x/wk for just a  couple minutes, then goes away.  Brought on when Sitting too long Feels balance is decreased  Pain in R groin.  No AD.  Lives alone  PERTINENT HISTORY: Bradycardia, HTN,  PAIN:  Are you having pain? No 0/10 today just stiffness   PRECAUTIONS: Fall  WEIGHT BEARING RESTRICTIONS: No  FALLS:  Has patient fallen in last 6 months? Yes. Number of falls 3  PLOF: Independent  PATIENT GOALS:  Decreased pain , improved strength, mobility   NEXT MD VISIT:   OBJECTIVE:   DIAGNOSTIC FINDINGS:   PATIENT SURVEYS:   COGNITION: Overall cognitive status: Within functional limits for tasks assessed     SENSATION: WFL  EDEMA:   POSTURE:    No Significant postural limitations  PALPATION:   LOWER EXTREMITY ROM:  Shoulder Flexion: 125 bil;  Knees:  0- 90 deg bil  Lumbar: mild limitation for ext and SB, Hips: mild limitation for rotation    LOWER EXTREMITY MMT:  Shoulder strength: 3-/5 for flex and abd  Knees: 4/5  Hips: 4-/5   LOWER EXTREMITY SPECIAL TESTS:   TUG:  13.35 sec    TODAY'S TREATMENT:                                                                                                                              DATE:   02/01/2023   Therapeutic Exercise: Aerobic:   Supine:  SKTC stretch 5x5 seconds B,  lumbar rotation stretch + TA set 5x5 seconds B,  Seated:    Standing:   forward step ups 6 inch step x7 B, lateral step ups 4 inch step x7 B, forward lunges 4 inch step x5 B no UEs, hip hikes x10 B, hip hikes + ABD x10 B, STS from low mat table x5 cues for "nose over  toes"  Stretches:   piriformis stretch 2x30 seconds B, hip flexor stretch 2x30 seconds B, forward QL stretch 2x30 seconds  Neuromuscular Re-education:   Manual Therapy: Therapeutic Activity: Self Care:       Previous  Therapeutic Exercise: Aerobic:   Supine:  reviewed SLR for HEP x 10 bil;  Seated:   sit to stands , regular chair, 3x 5 no UE support;   LAQ 3 lb x 15 bil;   HSC, GTB x 15 bil;  Standing:   hip abd and ext 2 x 10 bil;  Marching 2 x 10; Scap squeeze 1 x 10 for posture;   Rows(standing at chair) GTB x 15;  Step ups 6 in x 5 bil, with 1 UE support on L,  2 UE support on R; 4 in step up with 1 UE support x 12 on R;  up/down 5 stairs x 4, with 2 hand rails.   staggered stance weight shifts with light UE support at counter x 15 bil;  Stretches:     Neuromuscular Re-education:   Manual Therapy: Therapeutic Activity: Self Care:    Therapeutic Exercise: Aerobic: Supine:   pelvic tilts x 10;  clams GTB x 20;  SLR x 10 bil;  Seated:   pelvic tilts x 20- with education on mechanics.  Standing:   hip abd 2 x 10 bil;  Stretches:  LTR x 15;   SKTC 20 sec x 3 bil ,    Neuromuscular Re-education:    Manual Therapy: Therapeutic Activity: Self Care:   PATIENT EDUCATION:  Education details: reviewed HEP Person educated: Patient Education method: Explanation, Demonstration, Tactile cues, Verbal cues, and Handouts Education comprehension: verbalized understanding, returned demonstration, verbal cues required, tactile cues required, and needs further education   HOME EXERCISE PROGRAM: Access Code: [email or web address]   ASSESSMENT:  CLINICAL IMPRESSION: 02/01/2023   Alexandra Dixon arrives today doing well, pain is much better and she is mostly just feeling stiff today. Worked a bit on lower back and hip mobility  and then continued with progression of LE strengthening this session. Tolerated all interventions well without increased pain, will continue to progress as tolerated.   Eval:  Patient presents with primary complaint of  stiffness and pain in low back. She has weakness in LEs and core, and has lack of effective HEP. She has decreased ability for full functional activities, ADLs, and IADLs, due to pain and deficit.  Pt will  benefit from skilled PT to improve deficits and pain and to return to PLOF. Focus will be on back pain, LE and core strength, and functional mobility/balance with gait and stairs.   OBJECTIVE IMPAIRMENTS: decreased activity tolerance, decreased mobility, decreased strength, increased muscle spasms, improper body mechanics, and pain.   ACTIVITY LIMITATIONS: carrying, lifting, standing, squatting, stairs, transfers, hygiene/grooming, and locomotion level  PARTICIPATION LIMITATIONS: meal prep, cleaning, laundry, shopping, and community activity  PERSONAL FACTORS: Past/current experiences and multiple pain locations   are also affecting patient's functional outcome.   REHAB POTENTIAL: Good  CLINICAL DECISION MAKING: Stable/uncomplicated  EVALUATION COMPLEXITY: Low   GOALS: Goals reviewed with patient? Yes  SHORT TERM GOALS: Target date: 01/19/2023  Pt to be independent with initial HEP  Goal status: INITIAL    LONG TERM GOALS: Target date: 03/02/2023  Pt to be independent with final HEP  Goal status: INITIAL  2.  Pt to demo ability for functional squat, bend, with mechanics WNL, to improve ability for IADLS and decreased pain pain.   Goal status: INITIAL  3.  Pt to demo improved strength of hips and core to be [facility] for pt age, to improve pain and function.   Goal status: INITIAL  4.  Pt to demo ability for gait and dynamic activity with balance WFL for pt age, to improve fall risk and ability for community activity.   Goal status: INITIAL   PLAN:  PT FREQUENCY: 1-2x/week  PT DURATION: 8 weeks  PLANNED INTERVENTIONS: Therapeutic exercises, Therapeutic activity, Neuromuscular re-education, Patient/Family education, Self Care,  Joint mobilization, Joint manipulation, Stair training, Orthotic/Fit training, DME instructions, Aquatic Therapy, Dry Needling, Electrical stimulation, Cryotherapy, Moist heat, Taping, Ultrasound, Ionotophoresis 4mg /ml Dexamethasone, Manual therapy,  Vasopneumatic device, Traction, Spinal manipulation, Spinal mobilization,Balance training, Gait training,   PLAN FOR NEXT SESSION: hip strength, LE strength, standing balance, functional strength, stairs, posture with walking.   Nedra Hai, PT, DPT 02/01/23 1:44 PM

## 2023-02-06 ENCOUNTER — Ambulatory Visit: Payer: Medicare PPO | Admitting: Physical Therapy

## 2023-02-06 DIAGNOSIS — M6281 Muscle weakness (generalized): Secondary | ICD-10-CM

## 2023-02-06 DIAGNOSIS — R2689 Other abnormalities of gait and mobility: Secondary | ICD-10-CM | POA: Diagnosis not present

## 2023-02-06 DIAGNOSIS — M5459 Other low back pain: Secondary | ICD-10-CM

## 2023-02-06 NOTE — Therapy (Unsigned)
OUTPATIENT PHYSICAL THERAPY LOWER EXTREMITY TREATMENT    Patient Name: Alexandra Dixon MRN: [id number] DOB:Dec 03, 1935, 87 y.o., female Today's Date: 02/06/2023  END OF SESSION:      Past Medical History:  Diagnosis Date   Glaucoma    Heart murmur    Hyperlipemia    Hypothyroidism    Obesity    Sinus bradycardia    Systemic hypertension    Past Surgical History:  Procedure Laterality Date   NM MYOVIEW LTD  07/27/2006   low risk, cannot exclude subtle anteroapical ischemia   REPLACEMENT TOTAL KNEE  08/21/2006   left knee and right knee   US ECHOCARDIOGRAPHY  06/25/2007   mild LVH,LA mildly dilated,mild to mod mitral annular ca+,mild MR,mild to mod TR,AOV mildly sclerotic,aortic root sclerosis   Patient Active Problem List   Diagnosis Date Noted   First degree AV block 07/22/2015   Left carotid bruit 03/19/2014   Bradycardia 12/31/2012   Morbid obesity (HCC) 12/31/2012   Hyperlipidemia 12/31/2012   Glaucoma 12/31/2012   Essential hypertension 12/31/2012    PCP: Renaye Rakers  REFERRING PROVIDER: Renaye Rakers  REFERRING DIAG: Renaye Rakers  THERAPY DIAG:  No diagnosis found.  Rationale for Evaluation and Treatment: Rehabilitation  ONSET DATE:    SUBJECTIVE:   SUBJECTIVE STATEMENT: L hip sore in the last couple days.   I'm feeling a little stiff today but that's nothing unusual. No falls or close calls since last time, HEP is going well but have had a lot of visitors coming without telling me which has got me a bit off track.   Eval: Pt reports multiple areas of concern.  Back is stiff, low back. - main complaint.  Bil knee TKA 22, 23, Dr. Valentina Gu.  Did make good outcome.  [facility], [facility]?  Bil feet injections lateral ankles.  Did help some  1 yr ago. Ankles feel weak, Feels R foot/Leg is weaker.  Tingling in arms and back- Arms : down both arms few minutes only/intermittently Has L and R trigger fingers.  Feels "tingling" in low back, 2x/wk for just a   couple minutes, then goes away.  Brought on when Sitting too long Feels balance is decreased  Pain in R groin.  No AD.  Lives alone  PERTINENT HISTORY: Bradycardia, HTN,  PAIN:  Are you having pain? No 0/10 today just stiffness   PRECAUTIONS: Fall  WEIGHT BEARING RESTRICTIONS: No  FALLS:  Has patient fallen in last 6 months? Yes. Number of falls 3  PLOF: Independent  PATIENT GOALS:  Decreased pain , improved strength, mobility   NEXT MD VISIT:   OBJECTIVE:   DIAGNOSTIC FINDINGS:   PATIENT SURVEYS:   COGNITION: Overall cognitive status: Within functional limits for tasks assessed     SENSATION: WFL  EDEMA:   POSTURE:    No Significant postural limitations  PALPATION:   LOWER EXTREMITY ROM:  Shoulder Flexion: 125 bil;  Knees:  0- 90 deg bil  Lumbar: mild limitation for ext and SB, Hips: mild limitation for rotation    LOWER EXTREMITY MMT:  Shoulder strength: 3-/5 for flex and abd  Knees: 4/5  Hips: 4-/5   LOWER EXTREMITY SPECIAL TESTS:   TUG:  13.35 sec    TODAY'S TREATMENT:  DATE:   02/06/2023 Therapeutic Exercise: Aerobic:   Supine:  SLR 2 x 10 bil;  Seated:   LAQ 3 lb 2 x 10 bil;  Standing:   forward step ups 6 inch step x7 B,    hip ABD 2 x 10 B,  STS from mat table x 10 Stretches:   piriformis stretch 2x30 seconds B, hip flexor stretch 2x30 seconds B,  Neuromuscular Re-education:   Manual Therapy: Therapeutic Activity: Self Care:    Previous Therapeutic Exercise: Aerobic:   Supine:  SKTC stretch 5x5 seconds B,  lumbar rotation stretch + TA set 5x5 seconds B,  Seated:    Standing:   forward step ups 6 inch step x7 B, lateral step ups 4 inch step x7 B, forward lunges 4 inch step x5 B no UEs, hip hikes x10 B, hip hikes + ABD x10 B, STS from low mat table x5 cues for "nose over toes"  Stretches:   piriformis  stretch 2x30 seconds B, hip flexor stretch 2x30 seconds B, forward QL stretch 2x30 seconds  Neuromuscular Re-education:   Manual Therapy: Therapeutic Activity: Self Care:  Therapeutic Exercise: Aerobic:   Supine:  reviewed SLR for HEP x 10 bil;  Seated:   sit to stands , regular chair, 3x 5 no UE support;   LAQ 3 lb x 15 bil;   HSC, GTB x 15 bil;  Standing:   hip abd and ext 2 x 10 bil;  Marching 2 x 10; Scap squeeze 1 x 10 for posture;   Rows(standing at chair) GTB x 15;  Step ups 6 in x 5 bil, with 1 UE support on L,  2 UE support on R; 4 in step up with 1 UE support x 12 on R;  up/down 5 stairs x 4, with 2 hand rails.   staggered stance weight shifts with light UE support at counter x 15 bil;  Stretches:     Neuromuscular Re-education:   Manual Therapy: Therapeutic Activity: Self Care:    Therapeutic Exercise: Aerobic: Supine:   pelvic tilts x 10;  clams GTB x 20;  SLR x 10 bil;  Seated:   pelvic tilts x 20- with education on mechanics.  Standing:   hip abd 2 x 10 bil;  Stretches:  LTR x 15;   SKTC 20 sec x 3 bil ,    Neuromuscular Re-education:    Manual Therapy: Therapeutic Activity: Self Care:   PATIENT EDUCATION:  Education details: reviewed HEP Person educated: Patient Education method: Explanation, Demonstration, Tactile cues, Verbal cues, and Handouts Education comprehension: verbalized understanding, returned demonstration, verbal cues required, tactile cues required, and needs further education   HOME EXERCISE PROGRAM: Access Code: [email or web address]   ASSESSMENT:  CLINICAL IMPRESSION: 02/06/2023   [name] arrives today doing well, pain is much better and she is mostly just feeling stiff today. Worked a bit on lower back and hip mobility and then continued with progression of LE strengthening this session. Tolerated all interventions well without increased pain, will continue to progress as tolerated.   Eval: Patient presents with primary complaint of  stiffness  and pain in low back. She has weakness in LEs and core, and has lack of effective HEP. She has decreased ability for full functional activities, ADLs, and IADLs, due to pain and deficit.  Pt will  benefit from skilled PT to improve deficits and pain and to return to PLOF. Focus will be on back pain, LE and core strength, and functional mobility/balance with gait and  stairs.   OBJECTIVE IMPAIRMENTS: decreased activity tolerance, decreased mobility, decreased strength, increased muscle spasms, improper body mechanics, and pain.   ACTIVITY LIMITATIONS: carrying, lifting, standing, squatting, stairs, transfers, hygiene/grooming, and locomotion level  PARTICIPATION LIMITATIONS: meal prep, cleaning, laundry, shopping, and community activity  PERSONAL FACTORS: Past/current experiences and multiple pain locations   are also affecting patient's functional outcome.   REHAB POTENTIAL: Good  CLINICAL DECISION MAKING: Stable/uncomplicated  EVALUATION COMPLEXITY: Low   GOALS: Goals reviewed with patient? Yes  SHORT TERM GOALS: Target date: 01/19/2023  Pt to be independent with initial HEP  Goal status: INITIAL   LONG TERM GOALS: Target date: 03/02/2023  Pt to be independent with final HEP  Goal status: INITIAL  2.  Pt to demo ability for functional squat, bend, with mechanics WNL, to improve ability for IADLS and decreased pain pain.   Goal status: INITIAL  3.  Pt to demo improved strength of hips and core to be [facility] for pt age, to improve pain and function.   Goal status: INITIAL  4.  Pt to demo ability for gait and dynamic activity with balance WFL for pt age, to improve fall risk and ability for community activity.   Goal status: INITIAL   PLAN:  PT FREQUENCY: 1-2x/week  PT DURATION: 8 weeks  PLANNED INTERVENTIONS: Therapeutic exercises, Therapeutic activity, Neuromuscular re-education, Patient/Family education, Self Care, Joint mobilization, Joint manipulation, Stair training,  Orthotic/Fit training, DME instructions, Aquatic Therapy, Dry Needling, Electrical stimulation, Cryotherapy, Moist heat, Taping, Ultrasound, Ionotophoresis 4mg /ml Dexamethasone, Manual therapy,  Vasopneumatic device, Traction, Spinal manipulation, Spinal mobilization,Balance training, Gait training,   PLAN FOR NEXT SESSION: hip strength, LE strength, standing balance, functional strength, stairs, posture with walking.    Sedalia Muta, PT, DPT 11:06 AM  02/06/23

## 2023-02-07 ENCOUNTER — Encounter: Payer: Self-pay | Admitting: Physical Therapy

## 2023-02-09 ENCOUNTER — Encounter: Payer: Medicare PPO | Admitting: Physical Therapy

## 2023-02-15 ENCOUNTER — Ambulatory Visit: Payer: Medicare PPO | Admitting: Physical Therapy

## 2023-02-15 ENCOUNTER — Encounter: Payer: Self-pay | Admitting: Physical Therapy

## 2023-02-15 DIAGNOSIS — M6281 Muscle weakness (generalized): Secondary | ICD-10-CM

## 2023-02-15 DIAGNOSIS — M5459 Other low back pain: Secondary | ICD-10-CM

## 2023-02-15 DIAGNOSIS — R2689 Other abnormalities of gait and mobility: Secondary | ICD-10-CM | POA: Diagnosis not present

## 2023-02-15 NOTE — Therapy (Signed)
OUTPATIENT PHYSICAL THERAPY LOWER EXTREMITY TREATMENT    Patient Name: Alexandra Dixon MRN: [id number] DOB:[birth year], 87 y.o., female Today's Date: 02/15/2023  END OF SESSION:  PT End of Session - 02/15/23 1356     Visit Number 9    Number of Visits 16    Date for PT Re-Evaluation 03/02/23    Authorization Type [employer]    PT Start Time 1148    PT Stop Time 1229    PT Time Calculation (min) 41 min    Activity Tolerance Patient tolerated treatment well    Behavior During Therapy WFL for tasks assessed/performed                 Past Medical History:  Diagnosis Date   Glaucoma    Heart murmur    Hyperlipemia    Hypothyroidism    Obesity    Sinus bradycardia    Systemic hypertension    Past Surgical History:  Procedure Laterality Date   NM MYOVIEW LTD  07/27/2006   low risk, cannot exclude subtle anteroapical ischemia   REPLACEMENT TOTAL KNEE  08/21/2006   left knee and right knee   US ECHOCARDIOGRAPHY  06/25/2007   mild LVH,LA mildly dilated,mild to mod mitral annular ca+,mild MR,mild to mod TR,AOV mildly sclerotic,aortic root sclerosis   Patient Active Problem List   Diagnosis Date Noted   First degree AV block 07/22/2015   Left carotid bruit 03/19/2014   Bradycardia 12/31/2012   Morbid obesity (HCC) 12/31/2012   Hyperlipidemia 12/31/2012   Glaucoma 12/31/2012   Essential hypertension 12/31/2012    PCP: Renaye Rakers  REFERRING PROVIDER: Renaye Rakers  REFERRING DIAG: Renaye Rakers  THERAPY DIAG:  Other low back pain  Other abnormalities of gait and mobility  Muscle weakness (generalized)  Rationale for Evaluation and Treatment: Rehabilitation  ONSET DATE:    SUBJECTIVE:   SUBJECTIVE STATEMENT: Pt states  hip still a bit sore. Her back has overall been feeling less stiff and she feels like she is doing well. She reports not doing her HEP as often as she should this week.    Eval: Pt reports multiple areas of concern.  Back is stiff, low back. -  main complaint.  Bil knee TKA 22, 23, Dr. Valentina Gu.  Did make good outcome.  [facility], [facility]?  Bil feet injections lateral ankles.  Did help some  1 yr ago. Ankles feel weak, Feels R foot/Leg is weaker.  Tingling in arms and back- Arms : down both arms few minutes only/intermittently Has L and R trigger fingers.  Feels "tingling" in low back, 2x/wk for just a  couple minutes, then goes away.  Brought on when Sitting too long Feels balance is decreased  Pain in R groin.  No AD.  Lives alone  PERTINENT HISTORY: Bradycardia, HTN,  PAIN:  Are you having pain? No 0/10 today just stiffness   PRECAUTIONS: Fall  WEIGHT BEARING RESTRICTIONS: No  FALLS:  Has patient fallen in last 6 months? Yes. Number of falls 3  PLOF: Independent  PATIENT GOALS:  Decreased pain , improved strength, mobility   NEXT MD VISIT:   OBJECTIVE:   DIAGNOSTIC FINDINGS:   PATIENT SURVEYS:   COGNITION: Overall cognitive status: Within functional limits for tasks assessed     SENSATION: WFL  EDEMA:   POSTURE:    No Significant postural limitations  PALPATION:   LOWER EXTREMITY ROM:  Shoulder Flexion: 125 bil;  Knees:  0- 90 deg bil  Lumbar: mild  limitation for ext and SB, Hips: mild limitation for rotation    LOWER EXTREMITY MMT:  Shoulder strength: 3-/5 for flex and abd  Knees: 4/5  Hips: 4-/5   LOWER EXTREMITY SPECIAL TESTS:   TUG:  13.35 sec    TODAY'S TREATMENT:                                                                                                                              DATE:   02/15/2023 Therapeutic Exercise: Aerobic:   Supine:   Seated:    S/L: hip abd 2 x 5 bil;  (max cueing for positioning)  Standing:   ,  hip ABD 2 x 10 B,  Marching x 20;  STS from mat table x 10 Side stepping at counter 10 ft x 10;  bwd walking in hallway 15 ft x 4;  Stretches:    Neuromuscular Re-education:   Manual Therapy: DTM/tennis ball to R glute  Therapeutic  Activity: Self Care:   Therapeutic Exercise: Aerobic:   Supine:  SLR 2 x 10 bil;  Seated:   LAQ 3 lb 2 x 10 bil;  Standing:   forward step ups 6 inch step x7 B,    hip ABD 2 x 10 B,  STS from mat table x 10 Stairs 5 steps x 6, 1-2 ue support, cuing to use hands for support but not pulling self up stairs.  Stretches:   piriformis stretch 2x30 seconds B,  Neuromuscular Re-education:   Manual Therapy: Therapeutic Activity: Self Care:    Previous Therapeutic Exercise: Aerobic:   Supine:  SKTC stretch 5x5 seconds B,  lumbar rotation stretch + TA set 5x5 seconds B,  Seated:    Standing:   forward step ups 6 inch step x7 B, lateral step ups 4 inch step x7 B, forward lunges 4 inch step x5 B no UEs, hip hikes x10 B, hip hikes + ABD x10 B, STS from low mat table x5 cues for "nose over toes"  Stretches:   piriformis stretch 2x30 seconds B, hip flexor stretch 2x30 seconds B, forward QL stretch 2x30 seconds  Neuromuscular Re-education:   Manual Therapy: Therapeutic Activity: Self Care:     PATIENT EDUCATION:  Education details: reviewed HEP Person educated: Patient Education method: Explanation, Demonstration, Tactile cues, Verbal cues, and Handouts Education comprehension: verbalized understanding, returned demonstration, verbal cues required, tactile cues required, and needs further education   HOME EXERCISE PROGRAM: Access Code: [email or web address]   ASSESSMENT:  CLINICAL IMPRESSION: 02/15/2023 Pt progressing well with back pain, strength and overall mobility. Soreness in hip improved with manual today, but no increased pain with ther ex. She did well with standing stability/stepping today, She Will benefit from continued care for strength, functional mobility and confidence on stairs   Eval: Patient presents with primary complaint of  stiffness and pain in low back. She has weakness in LEs and core, and has lack of effective HEP. She has decreased ability for  full functional activities,  ADLs, and IADLs, due to pain and deficit.  Pt will  benefit from skilled PT to improve deficits and pain and to return to PLOF. Focus will be on back pain, LE and core strength, and functional mobility/balance with gait and stairs.   OBJECTIVE IMPAIRMENTS: decreased activity tolerance, decreased mobility, decreased strength, increased muscle spasms, improper body mechanics, and pain.   ACTIVITY LIMITATIONS: carrying, lifting, standing, squatting, stairs, transfers, hygiene/grooming, and locomotion level  PARTICIPATION LIMITATIONS: meal prep, cleaning, laundry, shopping, and community activity  PERSONAL FACTORS: Past/current experiences and multiple pain locations   are also affecting patient's functional outcome.   REHAB POTENTIAL: Good  CLINICAL DECISION MAKING: Stable/uncomplicated  EVALUATION COMPLEXITY: Low   GOALS: Goals reviewed with patient? Yes  SHORT TERM GOALS: Target date: 01/19/2023  Pt to be independent with initial HEP  Goal status: MET   LONG TERM GOALS: Target date: 03/02/2023  Pt to be independent with final HEP  Goal status: INITIAL  2.  Pt to demo ability for functional squat, bend, with mechanics WNL, to improve ability for IADLS and decreased pain pain.   Goal status: INITIAL  3.  Pt to demo improved strength of hips and core to be [facility] for pt age, to improve pain and function.   Goal status: INITIAL  4.  Pt to demo ability for gait and dynamic activity with balance WFL for pt age, to improve fall risk and ability for community activity.   Goal status: INITIAL   PLAN:  PT FREQUENCY: 1-2x/week  PT DURATION: 8 weeks  PLANNED INTERVENTIONS: Therapeutic exercises, Therapeutic activity, Neuromuscular re-education, Patient/Family education, Self Care, Joint mobilization, Joint manipulation, Stair training, Orthotic/Fit training, DME instructions, Aquatic Therapy, Dry Needling, Electrical stimulation, Cryotherapy, Moist heat, Taping, Ultrasound,  Ionotophoresis 4mg /ml Dexamethasone, Manual therapy,  Vasopneumatic device, Traction, Spinal manipulation, Spinal mobilization,Balance training, Gait training,   PLAN FOR NEXT SESSION: hip strength, LE strength, standing balance, functional strength, stairs, posture with walking.    Sedalia Muta, PT, DPT 1:57 PM  02/15/23

## 2023-02-17 ENCOUNTER — Encounter: Payer: Self-pay | Admitting: Physical Therapy

## 2023-02-17 ENCOUNTER — Ambulatory Visit: Payer: Medicare PPO | Admitting: Physical Therapy

## 2023-02-17 DIAGNOSIS — R2689 Other abnormalities of gait and mobility: Secondary | ICD-10-CM | POA: Diagnosis not present

## 2023-02-17 DIAGNOSIS — M6281 Muscle weakness (generalized): Secondary | ICD-10-CM | POA: Diagnosis not present

## 2023-02-17 DIAGNOSIS — M5459 Other low back pain: Secondary | ICD-10-CM

## 2023-02-17 NOTE — Therapy (Addendum)
OUTPATIENT PHYSICAL THERAPY LOWER EXTREMITY TREATMENT    Patient Name: Alexandra Dixon MRN: [id number] DOB:1935-07-01, 87 y.o., female Today's Date: 02/17/2023  END OF SESSION:  PT End of Session - 02/17/23 1145     Visit Number 10    Number of Visits 16    Date for PT Re-Evaluation 03/02/23    Authorization Type [employer]    PT Start Time 1149    PT Stop Time 1228    PT Time Calculation (min) 39 min    Activity Tolerance Patient tolerated treatment well    Behavior During Therapy WFL for tasks assessed/performed                 Past Medical History:  Diagnosis Date   Glaucoma    Heart murmur    Hyperlipemia    Hypothyroidism    Obesity    Sinus bradycardia    Systemic hypertension    Past Surgical History:  Procedure Laterality Date   NM MYOVIEW LTD  07/27/2006   low risk, cannot exclude subtle anteroapical ischemia   REPLACEMENT TOTAL KNEE  08/21/2006   left knee and right knee   US ECHOCARDIOGRAPHY  06/25/2007   mild LVH,LA mildly dilated,mild to mod mitral annular ca+,mild MR,mild to mod TR,AOV mildly sclerotic,aortic root sclerosis   Patient Active Problem List   Diagnosis Date Noted   First degree AV block 07/22/2015   Left carotid bruit 03/19/2014   Bradycardia 12/31/2012   Morbid obesity (HCC) 12/31/2012   Hyperlipidemia 12/31/2012   Glaucoma 12/31/2012   Essential hypertension 12/31/2012    PCP: Renaye Rakers  REFERRING PROVIDER: Renaye Rakers  REFERRING DIAG: Renaye Rakers  THERAPY DIAG:  Other low back pain  Other abnormalities of gait and mobility  Muscle weakness (generalized)  Rationale for Evaluation and Treatment: Rehabilitation  ONSET DATE:    SUBJECTIVE:   SUBJECTIVE STATEMENT: L hip much better, still only mildly sore. States she has doing better this week with exercises at home.   Reports from eval of R LE being weaker and tingling into low back are resolved.   Eval: Pt reports multiple areas of concern.  Back is stiff, low  back. - main complaint.  Bil knee TKA 22, 23, Dr. Valentina Gu.  Did make good outcome.  [facility], [facility]  Bil feet injections lateral ankles.  Did help some  1 yr ago. Ankles feel weak, Feels R foot/Leg is weaker.  Tingling in arms and back- Arms : down both arms few minutes only/intermittently Has L and R trigger fingers.  Feels "tingling" in low back, 2x/wk for just a  couple minutes, then goes away.  Brought on when Sitting too long Feels balance is decreased  Pain in R groin.  No AD.  Lives alone  PERTINENT HISTORY: Bradycardia, HTN,  PAIN:  Are you having pain? No 0/10 today just stiffness   PRECAUTIONS: Fall  WEIGHT BEARING RESTRICTIONS: No  FALLS:  Has patient fallen in last 6 months? Yes. Number of falls 3  PLOF: Independent  PATIENT GOALS:  Decreased pain , improved strength, mobility   NEXT MD VISIT:   OBJECTIVE:   DIAGNOSTIC FINDINGS:   PATIENT SURVEYS:   COGNITION: Overall cognitive status: Within functional limits for tasks assessed     SENSATION: WFL  EDEMA:   POSTURE:    No Significant postural limitations  PALPATION:   LOWER EXTREMITY ROM:  Shoulder Flexion: 125 bil;  Knees:  0- 90 deg bil  Lumbar: mild limitation for  ext and SB, Hips: mild limitation for rotation    LOWER EXTREMITY MMT:  Shoulder strength: 3-/5 for flex and abd  Knees: 4/5  Hips: 4-/5   LOWER EXTREMITY SPECIAL TESTS:   TUG:  13.35 sec    TODAY'S TREATMENT:                                                                                                                              DATE:   02/17/2023 Therapeutic Exercise: Aerobic:   Supine:   Seated:   Standing:   hip ABD 2 x 10 B,  Marching x 20 cuing for posture ; scap squeeze 2 x 10;  STS from mat table 2  x 10 Stretches:    Neuromuscular Re-education:  Side stepping at counter 10 ft x 10;  bwd walking in hallway 15 ft x 6;  toe taps on 6 in step x 20;  Manual Therapy:  Therapeutic Activity: Self  Care:   Therapeutic Exercise: Aerobic:   Supine:  SLR 2 x 10 bil;  Seated:   LAQ 3 lb 2 x 10 bil;  Standing:   forward step ups 6 inch step x7 B,    hip ABD 2 x 10 B,  STS from mat table x 10 Stairs 5 steps x 6, 1-2 ue support, cuing to use hands for support but not pulling self up stairs.  Stretches:   piriformis stretch 2x30 seconds B,  Neuromuscular Re-education:   Manual Therapy: Therapeutic Activity: Self Care:    Previous Therapeutic Exercise: Aerobic:   Supine:  SKTC stretch 5x5 seconds B,  lumbar rotation stretch + TA set 5x5 seconds B,  Seated:    Standing:   forward step ups 6 inch step x7 B, lateral step ups 4 inch step x7 B, forward lunges 4 inch step x5 B no UEs, hip hikes x10 B, hip hikes + ABD x10 B, STS from low mat table x5 cues for "nose over toes"  Stretches:   piriformis stretch 2x30 seconds B, hip flexor stretch 2x30 seconds B, forward QL stretch 2x30 seconds  Neuromuscular Re-education:   Manual Therapy: Therapeutic Activity: Self Care:     PATIENT EDUCATION:  Education details: reviewed HEP Person educated: Patient Education method: Explanation, Demonstration, Tactile cues, Verbal cues, and Handouts Education comprehension: verbalized understanding, returned demonstration, verbal cues required, tactile cues required, and needs further education   HOME EXERCISE PROGRAM: Access Code: [email or web address]   ASSESSMENT:  CLINICAL IMPRESSION: 02/17/2023   Pt progressing well with back pain, strength and overall mobility. She requires cuing for more hip and knee flexion to put foot on step vs circumducting leg. She has improved confidence and ability for stairs, still requires use of at least 1 UE support/railing, but improved use of legs, and decreased pulling from arms. Possible d/c next visit.   Eval: Patient presents with primary complaint of  stiffness and pain in low back. She has weakness in LEs and core,  and has lack of effective HEP. She has decreased  ability for full functional activities, ADLs, and IADLs, due to pain and deficit.  Pt will  benefit from skilled PT to improve deficits and pain and to return to PLOF. Focus will be on back pain, LE and core strength, and functional mobility/balance with gait and stairs.   OBJECTIVE IMPAIRMENTS: decreased activity tolerance, decreased mobility, decreased strength, increased muscle spasms, improper body mechanics, and pain.   ACTIVITY LIMITATIONS: carrying, lifting, standing, squatting, stairs, transfers, hygiene/grooming, and locomotion level  PARTICIPATION LIMITATIONS: meal prep, cleaning, laundry, shopping, and community activity  PERSONAL FACTORS: Past/current experiences and multiple pain locations   are also affecting patient's functional outcome.   REHAB POTENTIAL: Good  CLINICAL DECISION MAKING: Stable/uncomplicated  EVALUATION COMPLEXITY: Low   GOALS: Goals reviewed with patient? Yes  SHORT TERM GOALS: Target date: 01/19/2023  Pt to be independent with initial HEP  Goal status: MET   LONG TERM GOALS: Target date: 03/02/2023  Pt to be independent with final HEP  Goal status: INITIAL  2.  Pt to demo ability for functional squat, bend, with mechanics WNL, to improve ability for IADLS and decreased pain pain.   Goal status: INITIAL  3.  Pt to demo improved strength of hips and core to be [facility] for pt age, to improve pain and function.   Goal status: INITIAL  4.  Pt to demo ability for gait and dynamic activity with balance WFL for pt age, to improve fall risk and ability for community activity.   Goal status: INITIAL   PLAN:  PT FREQUENCY: 1-2x/week  PT DURATION: 8 weeks  PLANNED INTERVENTIONS: Therapeutic exercises, Therapeutic activity, Neuromuscular re-education, Patient/Family education, Self Care, Joint mobilization, Joint manipulation, Stair training, Orthotic/Fit training, DME instructions, Aquatic Therapy, Dry Needling, Electrical stimulation, Cryotherapy,  Moist heat, Taping, Ultrasound, Ionotophoresis 4mg /ml Dexamethasone, Manual therapy,  Vasopneumatic device, Traction, Spinal manipulation, Spinal mobilization,Balance training, Gait training,   PLAN FOR NEXT SESSION: review SLR,  Test TUG, test hip strength    Sedalia Muta, PT, DPT 12:38 PM  02/17/23   PHYSICAL THERAPY DISCHARGE SUMMARY  Visits from Start of Care: 10     Plan: Patient agrees to discharge.  Patient goals were  met. Patient is being discharged due to meeting the stated rehab goals.  Pt did not return for final visit, but was doing very well as last visit.    Sedalia Muta, PT, DPT 9:53 AM  03/21/23

## 2023-02-22 ENCOUNTER — Encounter: Payer: Medicare PPO | Admitting: Physical Therapy

## 2023-02-27 ENCOUNTER — Encounter: Payer: Medicare PPO | Admitting: Physical Therapy

## 2023-02-28 ENCOUNTER — Telehealth: Payer: Self-pay | Admitting: Cardiovascular Disease

## 2023-02-28 DIAGNOSIS — I10 Essential (primary) hypertension: Secondary | ICD-10-CM | POA: Diagnosis not present

## 2023-02-28 DIAGNOSIS — E789 Disorder of lipoprotein metabolism, unspecified: Secondary | ICD-10-CM | POA: Diagnosis not present

## 2023-02-28 DIAGNOSIS — N183 Chronic kidney disease, stage 3 unspecified: Secondary | ICD-10-CM | POA: Diagnosis not present

## 2023-02-28 DIAGNOSIS — E039 Hypothyroidism, unspecified: Secondary | ICD-10-CM | POA: Diagnosis not present

## 2023-02-28 DIAGNOSIS — I4891 Unspecified atrial fibrillation: Secondary | ICD-10-CM | POA: Diagnosis not present

## 2023-02-28 MED ORDER — METOPROLOL SUCCINATE ER 25 MG PO TB24: 25.0000 mg | ORAL_TABLET | Freq: Every day | ORAL | 3 refills | Status: DC

## 2023-02-28 NOTE — Telephone Encounter (Signed)
Pt returned call- informed her that a prescription was sent in for Toprol XL. She said she will go ahead and pick it up. Instructed her to go ahead and take it this evening; it makes some people drowzy- so many people take it at bedtime. Explained that this does slow down your heart rate, but also helps to get it back in a regular rhythm.  Gave her ER precautions- she verbalized understanding. Informed her that all of this info will be sent to Dr Royann Shivers and we will call with any further recommendations.

## 2023-02-28 NOTE — Addendum Note (Signed)
Addended by: Scheryl Marten on: 02/28/2023 04:08 PM   Modules accepted: Orders

## 2023-02-28 NOTE — Telephone Encounter (Signed)
Alexandra Royalty, MD- provider at [facility] says Pt is here at office and EKG shows Afib. Pt does not have a diagnosis of Afib. Informed him that Dr Royann Shivers is not in office today, will go to the Dr of the day and call him back.

## 2023-02-28 NOTE — Telephone Encounter (Signed)
Spoke with Dr [place] about this patient. He said that the patient needs lab work before starting on anticoagulation- pt has a history of kidney disease. He also asked what the pt's heart rate is, is she symptomatic, and would like to see the EKG.  Called Dr Yetta Barre back- he reports that the patient has not been seen there in 7 years so there is no recent lab work. He will get a CBC, CMP, lipid panel, and thyroid panel today. He states that the patient is asymptomatic. Her hr is 70-141- again, pt is asymptomatic.   [facility] will fax the EKG and will also fax her labs- once they come back (will not be today)  Pt has an appt with Dr Royann Shivers on 03/07/23.

## 2023-02-28 NOTE — Telephone Encounter (Signed)
Received EKG, took it to Dr [place]- DOD. He gave orders to start Metoprolol Succinate 25 mg daily.  Called Dr Yetta Barre to give this information to him, in case the patient has not left. He did not answer. Will call the patient.   Called the pt's mobile and home phone- left message with call back number.   Called pt's daughter, Judeth Cornfield [facility])- she said that her mother had called her after the appt at [facility] and should be home. I relayed the information that a medication will be sent in for the patient to start. Verified correct pharmacy.  RX sent for Metoprolol Succinate 25 mg once a day

## 2023-03-07 ENCOUNTER — Ambulatory Visit: Payer: Medicare PPO | Attending: Cardiovascular Disease

## 2023-03-07 ENCOUNTER — Ambulatory Visit: Payer: Medicare PPO | Attending: Cardiovascular Disease | Admitting: Cardiovascular Disease

## 2023-03-07 ENCOUNTER — Encounter: Payer: Self-pay | Admitting: Cardiovascular Disease

## 2023-03-07 VITALS — BP 118/50 | HR 54 | Wt 188.6 lb

## 2023-03-07 DIAGNOSIS — I480 Paroxysmal atrial fibrillation: Secondary | ICD-10-CM | POA: Diagnosis not present

## 2023-03-07 DIAGNOSIS — I10 Essential (primary) hypertension: Secondary | ICD-10-CM | POA: Diagnosis not present

## 2023-03-07 DIAGNOSIS — R001 Bradycardia, unspecified: Secondary | ICD-10-CM | POA: Diagnosis not present

## 2023-03-07 DIAGNOSIS — I350 Nonrheumatic aortic (valve) stenosis: Secondary | ICD-10-CM | POA: Diagnosis not present

## 2023-03-07 DIAGNOSIS — E782 Mixed hyperlipidemia: Secondary | ICD-10-CM | POA: Diagnosis not present

## 2023-03-07 DIAGNOSIS — N1832 Chronic kidney disease, stage 3b: Secondary | ICD-10-CM | POA: Diagnosis not present

## 2023-03-07 MED ORDER — DOXAZOSIN MESYLATE 1 MG PO TABS: 1.0000 mg | ORAL_TABLET | Freq: Every evening | ORAL | 3 refills | Status: DC

## 2023-03-07 MED ORDER — APIXABAN 2.5 MG PO TABS: 2.5000 mg | ORAL_TABLET | Freq: Two times a day (BID) | ORAL | 11 refills | Status: DC

## 2023-03-07 NOTE — Progress Notes (Unsigned)
Enrolled for [employer] to mail a ZIO XT long term holter monitor to the patients address on file.  

## 2023-03-07 NOTE — Progress Notes (Signed)
Patient ID: Alexandra Dixon, female   DOB: 06/01/1936, 87 y.o.   MRN: [id number]    Cardiology Office Note    Date:  03/07/2023   ID:  Alexandra Dixon, DOB Jan 16, 1936, MRN [id number]  PCP:  Renaye Rakers, MD  Cardiologist: Thurmon Fair, MD   Chief Complaint  Patient presents with   Irregular Heart Beat   Atrial Fibrillation    History of Present Illness:  Alexandra Dixon is a 87 y.o. female with morbid obesity, treated systemic hypertension (with LVH) , mild AS (echo Sep 2020),  hypothyroidism, hyperlipidemia on statin therapy and mild to moderate tricuspid insufficiency, first-degree AV block, who also has CKD stage III, glaucoma.   Her smart watch has recently shown episodes of accelerated heart rate in the 1 10-130 range, irregular suspicious for atrial fibrillation.  This was confirmed by ECG performed at her primary care provider's office which clearly shows atrial fibrillation.  She has minimal palpitations and the arrhythmia was not associated with significant shortness of breath, dizziness, syncope or chest pain.  When she checked into our office today initially heart rate was 106 bpm, by the time we did the ECG her rhythm was sinus bradycardia in the mid 50s.  In the past, she was intolerant of glaucoma eyedrops continue beta-blockers due to bradycardia down to the 40s.  She is now taking metoprolol succinate 25 mg daily and so far has not had severe bradycardia.  She was prescribed doxazosin 2 mg at bedtime to help with her elevated blood pressure and this has helped, but she does have some symptoms of orthostatic hypotension.  Today her blood pressure is 118/50 mmHg.  She stopped aspirin a while back because of easy bruising, but she has not had any serious bleeding problems.  I did  She remains independent and generally feels well.  She denies angina or dyspnea with activity.  She has not had orthopnea, PND, lower extremity edema, claudication, angina at rest or with  activity, syncope or palpitations.  She sees Dr. Allena Katz for her CKD.  Past Medical History:  Diagnosis Date   Glaucoma    Heart murmur    Hyperlipemia    Hypothyroidism    Obesity    Sinus bradycardia    Systemic hypertension     Past Surgical History:  Procedure Laterality Date   NM MYOVIEW LTD  07/27/2006   low risk, cannot exclude subtle anteroapical ischemia   REPLACEMENT TOTAL KNEE  08/21/2006   left knee and right knee   US ECHOCARDIOGRAPHY  06/25/2007   mild LVH,LA mildly dilated,mild to mod mitral annular ca+,mild MR,mild to mod TR,AOV mildly sclerotic,aortic root sclerosis    Outpatient Medications Prior to Visit  Medication Sig Dispense Refill   amLODipine (NORVASC) 2.5 MG tablet Take 1 tablet (2.5 mg total) by mouth daily. 180 tablet 3   dorzolamide (TRUSOPT) 2 % ophthalmic solution      ferrous sulfate 325 (65 FE) MG EC tablet Take 325 mg by mouth daily.     FLUOCINOLONE ACETONIDE SCALP 0.01 % OIL Apply to scalp nightly in areas of scale and itch.     furosemide (LASIX) 20 MG tablet 20 mg as needed. Take 1 Tablet Daily As Needed for Swelling or weight of 3 lbs Over Night and 5 lbs. In a Week.     hydrocortisone 2.5 % cream Apply to scaly itchy areas of face once daily as needed     levothyroxine (SYNTHROID, LEVOTHROID) 50 MCG tablet  Take 50 mcg by mouth daily before breakfast. Except Wednesdays and Sundays.     lisinopril-hydrochlorothiazide (PRINZIDE,ZESTORETIC) 20-25 MG per tablet Take 1 tablet by mouth daily.     metoprolol succinate (TOPROL XL) 25 MG 24 hr tablet Take 1 tablet (25 mg total) by mouth daily. 90 tablet 3   Multiple Vitamin (MULTIVITAMIN) tablet Take 1 tablet by mouth daily.     RHOPRESSA 0.02 % SOLN      rosuvastatin (CRESTOR) 20 MG tablet Take 20 mg by mouth daily.     TRAVATAN Z 0.004 % SOLN ophthalmic solution Place 1 drop into both eyes at bedtime.     Vibegron (GEMTESA) 75 MG TABS Take 75 mg by mouth. Take 1 Tablet daily     aspirin EC 81 MG tablet  Take 1 tablet (81 mg total) by mouth daily.     No facility-administered medications prior to visit.     Allergies:   Niacin   Social History   Socioeconomic History   Marital status: Married    Spouse name: Not on file   Number of children: Not on file   Years of education: Not on file   Highest education level: Not on file  Occupational History   Not on file  Tobacco Use   Smoking status: Never   Smokeless tobacco: Never  Substance and Sexual Activity   Alcohol use: No   Drug use: No   Sexual activity: Not on file  Other Topics Concern   Not on file  Social History Narrative   Not on file   Social Determinants of Health   Financial Resource Strain: Not on file  Food Insecurity: Not on file  Transportation Needs: Not on file  Physical Activity: Not on file  Stress: Not on file  Social Connections: Not on file     Family History:  The patient's family history includes Diabetes in her father; Heart failure in her father and mother; Kidney disease in her mother.   ROS:   Please see the history of present illness.    ROS All other systems are reviewed and are negative  PHYSICAL EXAM:   VS:  BP (!) 118/50   Pulse (!) 54   Wt 188 lb 9.6 oz (85.5 kg)   SpO2 98%   BMI 38.09 kg/m      General: Alert, oriented x3, no distress, morbidly obese Head: no evidence of trauma, PERRL, EOMI, no exophtalmos or lid lag, no myxedema, no xanthelasma; normal ears, nose and oropharynx Neck: normal jugular venous pulsations and no hepatojugular reflux; brisk carotid pulses without delay and no carotid bruits Chest: clear to auscultation, no signs of consolidation by percussion or palpation, normal fremitus, symmetrical and full respiratory excursions Cardiovascular: normal position and quality of the apical impulse, regular rhythm, normal first and second heart sounds, 2/6 systolic ejection murmur, no diastolic murmurs, rubs or gallops Abdomen: no tenderness or distention, no  masses by palpation, no abnormal pulsatility or arterial bruits, normal bowel sounds, no hepatosplenomegaly Extremities: no clubbing, cyanosis or edema; 2+ radial, ulnar and brachial pulses bilaterally; 2+ right femoral, posterior tibial and dorsalis pedis pulses; 2+ left femoral, posterior tibial and dorsalis pedis pulses; no subclavian or femoral bruits Neurological: grossly nonfocal Psych: Normal mood and affect    Wt Readings from Last 3 Encounters:  03/07/23 188 lb 9.6 oz (85.5 kg)  06/25/21 211 lb 6.4 oz (95.9 kg)  03/12/20 215 lb 6.4 oz (97.7 kg)      Studies/Labs Reviewed:  EKG:  EKG is ordered today.  It shows sinus bradycardia and mild first-degree AV block (PR interval 212 ms   ECHO 02/27/2019  1. The left ventricle has hyperdynamic systolic function, with an  ejection fraction of >65%. The cavity size was normal. Left ventricular  diastolic Doppler parameters are consistent with impaired relaxation.  Elevated left ventricular end-diastolic  pressure.   2. The right ventricle has normal systolic function. The cavity was  mildly enlarged. There is no increase in right ventricular wall thickness.  Right ventricular systolic pressure is normal with an estimated pressure  of 29.4 mmHg.   3. Left atrial size was moderately dilated.   4. Right atrial size was moderately dilated.   5. There is mild mitral annular calcification present.   6. The aortic valve is tricuspid. Moderate thickening of the aortic  valve. Moderate calcification of the aortic valve. There is mild aortic  stenosis. The mean AV gradient is , peak velocity 286cm/s and AVA  2.3cm2 by VTI.   7. The aorta is normal unless otherwise noted.   Lipid Panel  November 28, 2018 total cholesterol 301, HDL 86, LDL 193, triglycerides 101 December 27, 2019 total cholesterol 188, HDL 74, LDL 100, triglycerides 59 08/65/7846 cholesterol 174, HDL 66, LDL 94, triglycerides 48  Labs November 28, 2018 hemoglobin A1c 5.7%,  potassium 5.3, normal TSH and liver function tests December 24, 2018 creatinine 1.94 September 09, 2019 creatinine 1.54 December 27, 2019 hemoglobin 10.6, hemoglobin A1c 6%, normal liver function tests, potassium 4.5 12/08/2022 hemoglobin A1c 6.2%, potassium 5.3, ALT 13 12/20/2022 hemoglobin 9.3, creatinine 1.5.  ASSESSMENT:    1. Paroxysmal atrial fibrillation (HCC)   2. Aortic valve stenosis, nonrheumatic   3. Essential hypertension   4. Mixed hyperlipidemia   5. Severe obesity (BMI 35.0-39.9) with comorbidity (HCC)   6. Sinus bradycardia   7. Stage 3b chronic kidney disease (HCC)       PLAN:  In order of problems listed above:  AFib: Newly diagnosed and minimally symptomatic but associated with RVR.  Discussed the need to prevent stroke with potent anticoagulants.  Will prescribe Eliquis 2.5 mg twice daily (adjusted for age and renal dysfunction).  CHA2DS2-VASc score 4-5 (age 25, gender, HTN, +/- prediabetes).  Will have her wear an event monitor for couple of weeks to more precisely estimate the prevalence of atrial fibrillation and to assess the adequacy of rate control with the current dose of metoprolol.  Unfortunately, not a lot of room to provide additional rate control medications due to sinus bradycardia.  Digoxin is probably best avoided in view of her age and renal dysfunction. AS: Asymptomatic, mild by echo 2020.  Recheck echo. HTN: Doxazosin has helped control her blood pressure, but is causing the limit of orthostatic hypotension.  Since we are also starting metoprolol will decrease the doxazosin to 1 mg daily at bedtime.  Get up gradually, avoid jumping up too fast. HLP: Lipid profile acceptable considering the fact that she does not have any established CAD or PAD Obesity: Congratulated on losing weight, now no longer in morbid obesity range, but still severely obese. Sinus bradycardia: Has evidence of conduction system disease with borderline sinus bradycardia and first-degree AV  block.  In the past this was exacerbated by beta-blockers and glaucoma eyedrops, but so fast she seems to be tolerating the low-dose of oral metoprolol succinate.  It is almost certain that eventually she will need a pacemaker at some point, if she has symptomatic bradycardia. CKD 3B:  Stable renal function.  Followed by Dr. Allena Katz.  Has a tendency to hyperkalemia.  She is well educated about the need to avoid NSAIDs and other nephrotoxic agents.   Medication Adjustments/Labs and Tests Ordered: Current medicines are reviewed at length with the patient today.  Concerns regarding medicines are outlined above.  Medication changes, Labs and Tests ordered today are listed in the Patient Instructions below. Patient Instructions  Medication Instructions:  Stop Aspirin Eliquis 2.5 mg twice a day Doxazosin 1 mg at bedtime *If you need a refill on your cardiac medications before your next appointment, please call your pharmacy*  Testing/Procedures: Your physician has requested that you have an echocardiogram. Echocardiography is a painless test that uses sound waves to create images of your heart. It provides your doctor with information about the size and shape of your heart and how well your heart's chambers and valves are working. This procedure takes approximately one hour. There are no restrictions for this procedure. Please do NOT wear cologne, perfume, aftershave, or lotions (deodorant is allowed). Please arrive 15 minutes prior to your appointment time.    Your physician has recommended that you wear a 14 DAY ZIO-PATCH monitor. The Zio patch cardiac monitor continuously records heart rhythm data for up to 14 days, this is for patients being evaluated for multiple types heart rhythms. For the first 24 hours post application, please avoid getting the Zio monitor wet in the shower or by excessive sweating during exercise. After that, feel free to carry on with regular activities. Keep soaps and lotions  away from the ZIO XT Patch.  This will be mailed to you, please expect 7-10 days to receive.    Applying the monitor   Shave hair from upper left chest.   Hold abrader disc by orange tab.  Rub abrader in 40 strokes over left upper chest as indicated in your monitor instructions.   Clean area with 4 enclosed alcohol pads .  Use all pads to assure are is cleaned thoroughly.  Let dry.   Apply patch as indicated in monitor instructions.  Patch will be place under collarbone on left side of chest with arrow pointing upward.   Rub patch adhesive wings for 2 minutes.Remove white label marked "1".  Remove white label marked "2".  Rub patch adhesive wings for 2 additional minutes.   While looking in a mirror, press and release button in center of patch.  A small green light will flash 3-4 times .  This will be your only indicator the monitor has been turned on.     Do not shower for the first 24 hours.  You may shower after the first 24 hours.   Press button if you feel a symptom. You will hear a small click.  Record Date, Time and Symptom in the Patient Log Book.   When you are ready to remove patch, follow instructions on last 2 pages of Patient Log Book.  Stick patch monitor onto last page of Patient Log Book.   Place Patient Log Book in [place] box.  Use locking tab on box and tape box closed securely.  The Orange and [employer] has [employer] on it.  Please place in mailbox as soon as possible.  Your physician should have your test results approximately 7 days after the monitor has been mailed back to [facility].   Call [facility] Customer Care at [phone] if you have questions regarding your ZIO XT patch monitor.  Call them immediately if you  see an orange light blinking on your monitor.   If your monitor falls off in less than 4 days contact our Monitor department at [phone].  If your monitor becomes loose or falls off after 4 days call [employer] at [phone] for  suggestions on securing your monitor    Follow-Up: At [facility], you and your health needs are our priority.  As part of our continuing mission to provide you with exceptional heart care, we have created designated Provider Care Teams.  These Care Teams include your primary Cardiologist (physician) and Advanced Practice Providers (APPs -  Physician Assistants and Nurse Practitioners) who all work together to provide you with the care you need, when you need it.  We recommend signing up for the patient portal called "MyChart".  Sign up information is provided on this After Visit Summary.  MyChart is used to connect with patients for Virtual Visits (Telemedicine).  Patients are able to view lab/test results, encounter notes, upcoming appointments, etc.  Non-urgent messages can be sent to your provider as well.   To learn more about what you can do with MyChart, go to [email or web address].    Your next appointment:   4 month(s)  Provider:   Thurmon Fair, MD         Signed, Thurmon Fair, MD  03/07/2023 10:30 AM    [facility] [address] [place], [place], [place]  [address] Phone: [phone]; Fax: [phone]

## 2023-03-07 NOTE — Patient Instructions (Addendum)
Medication Instructions:  Stop Aspirin Eliquis 2.5 mg twice a day Doxazosin 1 mg at bedtime *If you need a refill on your cardiac medications before your next appointment, please call your pharmacy*  Testing/Procedures: Your physician has requested that you have an echocardiogram. Echocardiography is a painless test that uses sound waves to create images of your heart. It provides your doctor with information about the size and shape of your heart and how well your heart's chambers and valves are working. This procedure takes approximately one hour. There are no restrictions for this procedure. Please do NOT wear cologne, perfume, aftershave, or lotions (deodorant is allowed). Please arrive 15 minutes prior to your appointment time.    Your physician has recommended that you wear a 14 DAY ZIO-PATCH monitor. The Zio patch cardiac monitor continuously records heart rhythm data for up to 14 days, this is for patients being evaluated for multiple types heart rhythms. For the first 24 hours post application, please avoid getting the Zio monitor wet in the shower or by excessive sweating during exercise. After that, feel free to carry on with regular activities. Keep soaps and lotions away from the ZIO XT Patch.  This will be mailed to you, please expect 7-10 days to receive.    Applying the monitor   Shave hair from upper left chest.   Hold abrader disc by orange tab.  Rub abrader in 40 strokes over left upper chest as indicated in your monitor instructions.   Clean area with 4 enclosed alcohol pads .  Use all pads to assure are is cleaned thoroughly.  Let dry.   Apply patch as indicated in monitor instructions.  Patch will be place under collarbone on left side of chest with arrow pointing upward.   Rub patch adhesive wings for 2 minutes.Remove white label marked "1".  Remove white label marked "2".  Rub patch adhesive wings for 2 additional minutes.   While looking in a mirror, press and  release button in center of patch.  A small green light will flash 3-4 times .  This will be your only indicator the monitor has been turned on.     Do not shower for the first 24 hours.  You may shower after the first 24 hours.   Press button if you feel a symptom. You will hear a small click.  Record Date, Time and Symptom in the Patient Log Book.   When you are ready to remove patch, follow instructions on last 2 pages of Patient Log Book.  Stick patch monitor onto last page of Patient Log Book.   Place Patient Log Book in [place] box.  Use locking tab on box and tape box closed securely.  The [employer] and [employer] has [employer] on it.  Please place in mailbox as soon as possible.  Your physician should have your test results approximately 7 days after the monitor has been mailed back to [facility].   Call [facility] Customer Care at [phone] if you have questions regarding your ZIO XT patch monitor.  Call them immediately if you see an orange light blinking on your monitor.   If your monitor falls off in less than 4 days contact our Monitor department at [phone].  If your monitor becomes loose or falls off after 4 days call [employer] at [phone] for suggestions on securing your monitor    Follow-Up: At [facility], you and your health needs are our priority.  As part of our continuing mission  to provide you with exceptional heart care, we have created designated Provider Care Teams.  These Care Teams include your primary Cardiologist (physician) and Advanced Practice Providers (APPs -  Physician Assistants and Nurse Practitioners) who all work together to provide you with the care you need, when you need it.  We recommend signing up for the patient portal called "MyChart".  Sign up information is provided on this After Visit Summary.  MyChart is used to connect with patients for Virtual Visits (Telemedicine).  Patients are able to view lab/test results,  encounter notes, upcoming appointments, etc.  Non-urgent messages can be sent to your provider as well.   To learn more about what you can do with MyChart, go to [email or web address].    Your next appointment:   4 month(s)  Provider:   Thurmon Fair, MD

## 2023-03-08 DIAGNOSIS — Z1231 Encounter for screening mammogram for malignant neoplasm of breast: Secondary | ICD-10-CM | POA: Diagnosis not present

## 2023-03-12 DIAGNOSIS — I480 Paroxysmal atrial fibrillation: Secondary | ICD-10-CM

## 2023-03-15 ENCOUNTER — Telehealth: Payer: Self-pay | Admitting: Cardiovascular Disease

## 2023-03-15 NOTE — Telephone Encounter (Signed)
Call to patient and advised she can take Mucinex, plain nothing added

## 2023-03-15 NOTE — Telephone Encounter (Signed)
Patient calling to see if it would be okay for her to take mucinex. Please advise

## 2023-03-20 ENCOUNTER — Other Ambulatory Visit: Payer: Self-pay

## 2023-03-20 ENCOUNTER — Encounter (HOSPITAL_BASED_OUTPATIENT_CLINIC_OR_DEPARTMENT_OTHER): Payer: Self-pay | Admitting: Emergency Medicine

## 2023-03-20 ENCOUNTER — Emergency Department (HOSPITAL_BASED_OUTPATIENT_CLINIC_OR_DEPARTMENT_OTHER)
Admission: EM | Admit: 2023-03-20 | Discharge: 2023-03-20 | Disposition: A | Discharge status: Home / Self Care | Payer: Medicare PPO | Attending: Emergency Medicine | Admitting: Emergency Medicine

## 2023-03-20 ENCOUNTER — Emergency Department (HOSPITAL_BASED_OUTPATIENT_CLINIC_OR_DEPARTMENT_OTHER): Payer: Medicare PPO

## 2023-03-20 ENCOUNTER — Encounter: Payer: Self-pay | Admitting: Cardiovascular Disease

## 2023-03-20 ENCOUNTER — Telehealth: Payer: Self-pay | Admitting: Cardiovascular Disease

## 2023-03-20 DIAGNOSIS — I483 Typical atrial flutter: Secondary | ICD-10-CM | POA: Insufficient documentation

## 2023-03-20 DIAGNOSIS — R002 Palpitations: Secondary | ICD-10-CM | POA: Diagnosis present

## 2023-03-20 DIAGNOSIS — I700 Atherosclerosis of aorta: Secondary | ICD-10-CM | POA: Diagnosis not present

## 2023-03-20 DIAGNOSIS — I517 Cardiomegaly: Secondary | ICD-10-CM | POA: Diagnosis not present

## 2023-03-20 DIAGNOSIS — I4892 Unspecified atrial flutter: Secondary | ICD-10-CM | POA: Diagnosis not present

## 2023-03-20 DIAGNOSIS — Z7901 Long term (current) use of anticoagulants: Secondary | ICD-10-CM | POA: Diagnosis not present

## 2023-03-20 DIAGNOSIS — R079 Chest pain, unspecified: Secondary | ICD-10-CM | POA: Diagnosis not present

## 2023-03-20 DIAGNOSIS — E039 Hypothyroidism, unspecified: Secondary | ICD-10-CM | POA: Diagnosis not present

## 2023-03-20 LAB — CBC
HCT: 33.9 % — ABNORMAL LOW (ref 36.0–46.0)
Hemoglobin: 11.1 g/dL — ABNORMAL LOW (ref 12.0–15.0)
MCH: 27.5 pg (ref 26.0–34.0)
MCHC: 32.7 g/dL (ref 30.0–36.0)
MCV: 83.9 fL (ref 80.0–100.0)
Platelets: 190 10*3/uL (ref 150–400)
RBC: 4.04 MIL/uL (ref 3.87–5.11)
RDW: 14.9 % (ref 11.5–15.5)
WBC: 4.6 10*3/uL (ref 4.0–10.5)
nRBC: 0 % (ref 0.0–0.2)

## 2023-03-20 LAB — BASIC METABOLIC PANEL
Anion gap: 8 (ref 5–15)
BUN: 39 mg/dL — ABNORMAL HIGH (ref 8–23)
CO2: 22 mmol/L (ref 22–32)
Calcium: 9 mg/dL (ref 8.9–10.3)
Chloride: 109 mmol/L (ref 98–111)
Creatinine, Ser: 1.32 mg/dL — ABNORMAL HIGH (ref 0.44–1.00)
GFR, Estimated: 40 mL/min — ABNORMAL LOW (ref >60)
Glucose, Bld: 130 mg/dL — ABNORMAL HIGH (ref 70–99)
Potassium: 4.8 mmol/L (ref 3.5–5.1)
Sodium: 139 mmol/L (ref 135–145)

## 2023-03-20 LAB — TROPONIN I (HIGH SENSITIVITY)
Troponin I (High Sensitivity): 24 ng/L — ABNORMAL HIGH (ref <18)
Troponin I (High Sensitivity): 32 ng/L — ABNORMAL HIGH (ref <18)
Troponin I (High Sensitivity): 38 ng/L — ABNORMAL HIGH (ref <18)

## 2023-03-20 LAB — MAGNESIUM: Magnesium: 1.9 mg/dL (ref 1.7–2.4)

## 2023-03-20 MED ORDER — SODIUM CHLORIDE 0.9% FLUSH
3.0000 mL | Freq: Once | INTRAVENOUS | Status: AC
  Administered 2023-03-20: 3 mL via INTRAVENOUS

## 2023-03-20 NOTE — Telephone Encounter (Signed)
Pt called in to inform Dr. Royann Shivers she was in the ER for afib last night.

## 2023-03-20 NOTE — Telephone Encounter (Signed)
Spoke with patient and she states last night she was in AFIB and went to ED.  Her HR is currently 63. She states she is staying cool and hydrated. Currently asymptomatic. She is taking her medications as prescribed. Advised to continue taking medications as prescribed, continue to monitor HR, and staying hydrated. ED precautions discussed. Will forward to MD.

## 2023-03-20 NOTE — ED Provider Notes (Signed)
[place] EMERGENCY DEPARTMENT AT [facility] Provider Note   CSN: [id number] Arrival date & time: 03/20/23  0035     History  Chief Complaint  Patient presents with   Tachycardia    Alexandra Dixon is a 87 y.o. female.  HPI     This an 87 year old female with a history of paroxysmal atrial fibrillation on Eliquis who presents with palpitations.  Patient reports that she got bed tonight and felt palpitations.  She reports that she is currently wearing a heart monitor.  Had a similar episode and was seen at [facility] approximately 1 week ago when her heart rate was noted to be elevated on her watch.  She denies any recent illnesses.  No changes in medication.  She was placed on Eliquis by Dr. Salena Saner and is due to follow-up.  She denies any chest pain.  Home Medications Prior to Admission medications   Medication Sig Start Date End Date Taking? Authorizing Provider  amLODipine (NORVASC) 2.5 MG tablet Take 1 tablet (2.5 mg total) by mouth daily. 06/25/21 03/07/23  Joylene Grapes, NP  apixaban (ELIQUIS) 2.5 MG TABS tablet Take 1 tablet (2.5 mg total) by mouth 2 (two) times daily. 03/07/23   Croitoru, Mihai, MD  dorzolamide (TRUSOPT) 2 % ophthalmic solution  01/16/20   [provider]  doxazosin (CARDURA) 1 MG tablet Take 1 tablet (1 mg total) by mouth at bedtime. 03/07/23   Croitoru, Mihai, MD  ferrous sulfate 325 (65 FE) MG EC tablet Take 325 mg by mouth daily.    [provider]  FLUOCINOLONE ACETONIDE SCALP 0.01 % OIL Apply to scalp nightly in areas of scale and itch. 10/20/14   [provider]  furosemide (LASIX) 20 MG tablet 20 mg as needed. Take 1 Tablet Daily As Needed for Swelling or weight of 3 lbs Over Night and 5 lbs. In a Week. 05/01/19   [provider]  hydrocortisone 2.5 % cream Apply to scaly itchy areas of face once daily as needed 10/20/14   [provider]  levothyroxine (SYNTHROID, LEVOTHROID) 50 MCG tablet Take 50 mcg by  mouth daily before breakfast. Except Wednesdays and Sundays.    [provider]  lisinopril-hydrochlorothiazide (PRINZIDE,ZESTORETIC) 20-25 MG per tablet Take 1 tablet by mouth daily.    [provider]  metoprolol succinate (TOPROL XL) 25 MG 24 hr tablet Take 1 tablet (25 mg total) by mouth daily. 02/28/23   [place], Peter M, MD  Multiple Vitamin (MULTIVITAMIN) tablet Take 1 tablet by mouth daily.    [provider]  RHOPRESSA 0.02 % SOLN  01/29/20   [provider]  rosuvastatin (CRESTOR) 20 MG tablet Take 20 mg by mouth daily.    [provider]  TRAVATAN Z 0.004 % SOLN ophthalmic solution Place 1 drop into both eyes at bedtime. 10/26/12   [provider]  Vibegron (GEMTESA) 75 MG TABS Take 75 mg by mouth. Take 1 Tablet daily    [provider]      Allergies    Niacin    Review of Systems   Review of Systems  Constitutional:  Negative for fever.  Respiratory:  Negative for shortness of breath.   Cardiovascular:  Positive for palpitations. Negative for chest pain.  Gastrointestinal:  Negative for abdominal pain.  All other systems reviewed and are negative.   Physical Exam Updated Vital Signs BP (!) 125/52   Pulse (!) 50   Temp 98 F (36.7 C)   Resp  14   Ht 1.499 m (4\' 11" )   Wt 85.3 kg   SpO2 100%   BMI 37.97 kg/m  Physical Exam Vitals and nursing note reviewed.  Constitutional:      Appearance: She is well-developed. She is not ill-appearing.  HENT:     Head: Normocephalic and atraumatic.  Eyes:     Pupils: Pupils are equal, round, and reactive to light.  Cardiovascular:     Rate and Rhythm: Normal rate and regular rhythm.     Heart sounds: Normal heart sounds.  Pulmonary:     Effort: Pulmonary effort is normal. No respiratory distress.     Breath sounds: No wheezing.  Abdominal:     General: Bowel sounds are normal.     Palpations: Abdomen is soft.  Musculoskeletal:     Cervical back: Neck supple.   Skin:    General: Skin is warm and dry.  Neurological:     Mental Status: She is alert and oriented to person, place, and time.  Psychiatric:        Mood and Affect: Mood normal.     ED Results / Procedures / Treatments   Labs (all labs ordered are listed, but only abnormal results are displayed) Labs Reviewed  BASIC METABOLIC PANEL - Abnormal; Notable for the following components:      Result Value   Glucose, Bld 130 (*)    BUN 39 (*)    Creatinine, Ser 1.32 (*)    GFR, Estimated 40 (*)    All other components within normal limits  CBC - Abnormal; Notable for the following components:   Hemoglobin 11.1 (*)    HCT 33.9 (*)    All other components within normal limits  TROPONIN I (HIGH SENSITIVITY) - Abnormal; Notable for the following components:   Troponin I (High Sensitivity) 24 (*)    All other components within normal limits  TROPONIN I (HIGH SENSITIVITY) - Abnormal; Notable for the following components:   Troponin I (High Sensitivity) 32 (*)    All other components within normal limits  TROPONIN I (HIGH SENSITIVITY) - Abnormal; Notable for the following components:   Troponin I (High Sensitivity) 38 (*)    All other components within normal limits  MAGNESIUM    EKG None INitial EKG: atrial flutter with RVR Repeat EKG: Sinus rhythm Radiology DG Chest Port 1 View  Result Date: 03/20/2023 CLINICAL DATA:  Chest pain EXAM: PORTABLE CHEST 1 VIEW COMPARISON:  08/23/2007 FINDINGS: Cardiomegaly. Aortic atherosclerosis. No confluent opacities, effusions or edema. No acute bony abnormality. IMPRESSION: Cardiomegaly.  No active disease. Electronically Signed   By: Charlett Nose M.D.   On: 03/20/2023 01:15    Procedures Procedures    Medications Ordered in ED Medications  sodium chloride flush (NS) 0.9 % injection 3 mL (3 mLs Intravenous Given 03/20/23 0202)    ED Course/ Medical Decision Making/ A&P Clinical Course as of 03/20/23 0713  Mon Mar 20, 2023  1610 Patient  has remained asymptomatic since atrial flutter has aborted.  Troponins are minimally elevated.  She is currently asymptomatic.  Feel likely related to initial tachycardia.  Given she is asymptomatic, will discharge home and have her follow-up with cardiology. [CH]    Clinical Course User Index [CH] Camron Monday, Mayer Masker, MD                                 Medical Decision Making Amount and/or Complexity  of Data Reviewed Labs: ordered. Radiology: ordered.   This patient presents to the ED for concern of palpitations, this involves an extensive number of treatment options, and is a complaint that carries with it a high risk of complications and morbidity.  I considered the following differential and admission for this acute, potentially life threatening condition.  The differential diagnosis includes atrial fibrillation, atrial flutter, other arrhythmia, dehydration, metabolic derangement  MDM:    This is an 87 year old female who presents with concerns for palpitations.  Initial EKG is concerning for atrial flutter.  By the time I evaluated the patient in the room she was in sinus rhythm.  She remained in sinus rhythm and asymptomatic.  Her troponin was slightly elevated but this is in the setting of a creatinine of 1.32.  Troponins trended 24, 32, 38.  I suspect this is related to her significant tachycardia prior to arrival.  She has remained chest pain-free and without any signs or symptoms of ACS.  Will have her follow-up closely with Dr. Salena Saner.  Continue anticoagulant.  (Labs, imaging, consults)  Labs: I Ordered, and personally interpreted labs.  The pertinent results include: CBC, BMP, magnesium, troponin  Imaging Studies ordered: I ordered imaging studies including chest x-ray I independently visualized and interpreted imaging. I agree with the radiologist interpretation  Additional history obtained from chart review.  External records from outside source obtained and reviewed including  prior valuations  Cardiac Monitoring: The patient was maintained on a cardiac monitor.  If on the cardiac monitor, I personally viewed and interpreted the cardiac monitored which showed an underlying rhythm of: Atrial flutter to sinus rhythm  Reevaluation: After the interventions noted above, I reevaluated the patient and found that they have :improved  Social Determinants of Health:  lives independently  Disposition: Discharge  Co morbidities that complicate the patient evaluation  Past Medical History:  Diagnosis Date   Glaucoma    Heart murmur    Hyperlipemia    Hypothyroidism    Obesity    Sinus bradycardia    Systemic hypertension      Medicines Meds ordered this encounter  Medications   sodium chloride flush (NS) 0.9 % injection 3 mL    I have reviewed the patients home medicines and have made adjustments as needed  Problem List / ED Course: Problem List Items Addressed This Visit   None Visit Diagnoses     Typical atrial flutter (HCC)    -  Primary                   Final Clinical Impression(s) / ED Diagnoses Final diagnoses:  Typical atrial flutter (HCC)    Rx / DC Orders ED Discharge Orders          Ordered    Amb referral to AFIB Clinic        03/20/23 0132              Shon Baton, MD 03/20/23 [phone]

## 2023-03-20 NOTE — ED Triage Notes (Signed)
PT self ambulated to triage c/o "fast heart rate" x 1 hour. Pt states she is being followed by Cards for same and currently wearing heart monitor. PT denies CP or SOB. VSS NAD PT on room air. Current HR 138

## 2023-03-20 NOTE — Discharge Instructions (Addendum)
You were seen today for palpitations.  This is likely related to your atrial flutter.  Follow-up with your cardiologist.

## 2023-03-27 DIAGNOSIS — I4819 Other persistent atrial fibrillation: Secondary | ICD-10-CM | POA: Diagnosis not present

## 2023-03-27 DIAGNOSIS — I129 Hypertensive chronic kidney disease with stage 1 through stage 4 chronic kidney disease, or unspecified chronic kidney disease: Secondary | ICD-10-CM | POA: Diagnosis not present

## 2023-03-27 DIAGNOSIS — N189 Chronic kidney disease, unspecified: Secondary | ICD-10-CM | POA: Diagnosis not present

## 2023-03-27 DIAGNOSIS — E785 Hyperlipidemia, unspecified: Secondary | ICD-10-CM | POA: Diagnosis not present

## 2023-03-28 DIAGNOSIS — I480 Paroxysmal atrial fibrillation: Secondary | ICD-10-CM | POA: Diagnosis not present

## 2023-03-29 ENCOUNTER — Ambulatory Visit (HOSPITAL_COMMUNITY): Payer: Medicare PPO | Admitting: Physician Assistant

## 2023-04-11 ENCOUNTER — Ambulatory Visit (HOSPITAL_COMMUNITY): Payer: Medicare PPO | Attending: Cardiovascular Disease

## 2023-04-11 DIAGNOSIS — I350 Nonrheumatic aortic (valve) stenosis: Secondary | ICD-10-CM | POA: Insufficient documentation

## 2023-04-11 LAB — ECHOCARDIOGRAM COMPLETE
AR max vel: 0.89 cm2
AV Area VTI: 0.77 cm2
AV Area mean vel: 0.88 cm2
AV Mean grad: 19 mm[Hg]
AV Peak grad: 32.9 mm[Hg]
Ao pk vel: 2.87 m/s
Area-P 1/2: 4.21 cm2
MV M vel: 5.4 m/s
MV Peak grad: 116.6 mm[Hg]
P 1/2 time: 503 ms
Radius: 0.6 cm
S' Lateral: 2.2 cm

## 2023-04-18 DIAGNOSIS — H402221 Chronic angle-closure glaucoma, left eye, mild stage: Secondary | ICD-10-CM | POA: Diagnosis not present

## 2023-04-18 DIAGNOSIS — H401131 Primary open-angle glaucoma, bilateral, mild stage: Secondary | ICD-10-CM | POA: Diagnosis not present

## 2023-04-18 DIAGNOSIS — H2512 Age-related nuclear cataract, left eye: Secondary | ICD-10-CM | POA: Diagnosis not present

## 2023-04-18 DIAGNOSIS — H02889 Meibomian gland dysfunction of unspecified eye, unspecified eyelid: Secondary | ICD-10-CM | POA: Diagnosis not present

## 2023-04-19 DIAGNOSIS — M65332 Trigger finger, left middle finger: Secondary | ICD-10-CM | POA: Diagnosis not present

## 2023-04-26 DIAGNOSIS — M25641 Stiffness of right hand, not elsewhere classified: Secondary | ICD-10-CM | POA: Diagnosis not present

## 2023-05-02 DIAGNOSIS — M25642 Stiffness of left hand, not elsewhere classified: Secondary | ICD-10-CM | POA: Diagnosis not present

## 2023-05-09 DIAGNOSIS — M6284 Sarcopenia: Secondary | ICD-10-CM | POA: Diagnosis not present

## 2023-05-09 DIAGNOSIS — I129 Hypertensive chronic kidney disease with stage 1 through stage 4 chronic kidney disease, or unspecified chronic kidney disease: Secondary | ICD-10-CM | POA: Diagnosis not present

## 2023-05-09 DIAGNOSIS — I10 Essential (primary) hypertension: Secondary | ICD-10-CM | POA: Diagnosis not present

## 2023-05-09 DIAGNOSIS — E1169 Type 2 diabetes mellitus with other specified complication: Secondary | ICD-10-CM | POA: Diagnosis not present

## 2023-05-09 DIAGNOSIS — J399 Disease of upper respiratory tract, unspecified: Secondary | ICD-10-CM | POA: Diagnosis not present

## 2023-05-15 DIAGNOSIS — M65332 Trigger finger, left middle finger: Secondary | ICD-10-CM | POA: Diagnosis not present

## 2023-06-27 DIAGNOSIS — N1832 Chronic kidney disease, stage 3b: Secondary | ICD-10-CM | POA: Diagnosis not present

## 2023-06-27 DIAGNOSIS — N390 Urinary tract infection, site not specified: Secondary | ICD-10-CM | POA: Diagnosis not present

## 2023-07-06 DIAGNOSIS — D631 Anemia in chronic kidney disease: Secondary | ICD-10-CM | POA: Diagnosis not present

## 2023-07-06 DIAGNOSIS — N189 Chronic kidney disease, unspecified: Secondary | ICD-10-CM | POA: Diagnosis not present

## 2023-07-06 DIAGNOSIS — N1832 Chronic kidney disease, stage 3b: Secondary | ICD-10-CM | POA: Diagnosis not present

## 2023-07-06 DIAGNOSIS — I10 Essential (primary) hypertension: Secondary | ICD-10-CM | POA: Diagnosis not present

## 2023-07-06 DIAGNOSIS — N2581 Secondary hyperparathyroidism of renal origin: Secondary | ICD-10-CM | POA: Diagnosis not present

## 2023-07-06 DIAGNOSIS — I129 Hypertensive chronic kidney disease with stage 1 through stage 4 chronic kidney disease, or unspecified chronic kidney disease: Secondary | ICD-10-CM | POA: Diagnosis not present

## 2023-07-07 ENCOUNTER — Ambulatory Visit: Payer: Medicare PPO | Attending: Cardiovascular Disease | Admitting: Cardiovascular Disease

## 2023-07-07 ENCOUNTER — Encounter: Payer: Self-pay | Admitting: Cardiovascular Disease

## 2023-07-07 VITALS — BP 133/60 | HR 61 | Ht 59.0 in | Wt 183.8 lb

## 2023-07-07 DIAGNOSIS — R001 Bradycardia, unspecified: Secondary | ICD-10-CM | POA: Diagnosis not present

## 2023-07-07 DIAGNOSIS — E782 Mixed hyperlipidemia: Secondary | ICD-10-CM | POA: Diagnosis not present

## 2023-07-07 DIAGNOSIS — I480 Paroxysmal atrial fibrillation: Secondary | ICD-10-CM | POA: Diagnosis not present

## 2023-07-07 DIAGNOSIS — I350 Nonrheumatic aortic (valve) stenosis: Secondary | ICD-10-CM | POA: Diagnosis not present

## 2023-07-07 DIAGNOSIS — I10 Essential (primary) hypertension: Secondary | ICD-10-CM | POA: Diagnosis not present

## 2023-07-07 DIAGNOSIS — D6869 Other thrombophilia: Secondary | ICD-10-CM | POA: Diagnosis not present

## 2023-07-07 DIAGNOSIS — N1832 Chronic kidney disease, stage 3b: Secondary | ICD-10-CM

## 2023-07-07 NOTE — Progress Notes (Unsigned)
Patient ID: Alexandra Dixon, female   DOB: 1936/01/02, 88 y.o.   MRN: [id number]    Cardiology Office Note    Date:  07/08/2023   ID:  Alexandra Dixon 05-Apr-1936, MRN [id number]  PCP:  Alexandra Rakers, MD  Cardiologist: Alexandra Fair, MD   Chief Complaint  Patient presents with   Atrial Fibrillation    History of Present Illness:  Alexandra Dixon is a 88 y.o. female with morbid obesity, treated systemic hypertension (with LVH) , mild AS (echo Sep 2020),  hypothyroidism, hyperlipidemia on statin therapy and mild to moderate tricuspid insufficiency, first-degree AV block, who also has CKD stage III, glaucoma.   She is doing quite well since her last appointment her smart watch has not shown any episodes suspicious for atrial fibrillation.  She has not had any falls or serious bleeding and has relatively mild bruising problems on Eliquis.  She has not been troubled by palpitations, dyspnea, angina at rest or with activity, syncope, orthopnea, PND or significant lower extremity edema.  She lives independently and takes care of her household chores without assistance.  She actually reports feeling great.  After cutting back the dose of doxazosin she has not had any more dizziness.  She is very pleased with her current stamina and lack of cardiovascular symptoms.  Her atrial fibrillation was detected by her smart watch, confirmed by an ECG performed in her primary care doctor's office, but was essentially asymptomatic.  Her follow-up echocardiogram shows very little worsening of her aortic stenosis which remains mild with a mean gradient of 19 mmHg.  She has normal left ventricular systolic function.  Left atrium is severely dilated.  Metabolic control is good with a recent LDL cholesterol 94, hemoglobin A1c of 6.2% and unchanged creatinine at 1.32 (her nephrologist is Dr. Allena Dixon).  In the past, she was intolerant of glaucoma eyedrops continue beta-blockers due to bradycardia down to the  40s.  She is now taking metoprolol succinate 25 mg daily and so far has not had severe bradycardia.   Past Medical History:  Diagnosis Date   Glaucoma    Heart murmur    Hyperlipemia    Hypothyroidism    Obesity    Sinus bradycardia    Systemic hypertension     Past Surgical History:  Procedure Laterality Date   NM MYOVIEW LTD  07/27/2006   low risk, cannot exclude subtle anteroapical ischemia   REPLACEMENT TOTAL KNEE  08/21/2006   left knee and right knee   US ECHOCARDIOGRAPHY  06/25/2007   mild LVH,LA mildly dilated,mild to mod mitral annular ca+,mild MR,mild to mod TR,AOV mildly sclerotic,aortic root sclerosis    Outpatient Medications Prior to Visit  Medication Sig Dispense Refill   amLODipine (NORVASC) 2.5 MG tablet Take 1 tablet (2.5 mg total) by mouth daily. 180 tablet 3   apixaban (ELIQUIS) 2.5 MG TABS tablet Take 1 tablet (2.5 mg total) by mouth 2 (two) times daily. 60 tablet 11   dorzolamide (TRUSOPT) 2 % ophthalmic solution Place 1 drop into both eyes 2 (two) times daily.     doxazosin (CARDURA) 1 MG tablet Take 1 tablet (1 mg total) by mouth at bedtime. 90 tablet 3   ferrous sulfate 325 (65 FE) MG EC tablet Take 325 mg by mouth daily.     FLUOCINOLONE ACETONIDE SCALP 0.01 % OIL Apply to scalp nightly in areas of scale and itch.     furosemide (LASIX) 20 MG tablet 20 mg as needed.  Take 1 Tablet Daily As Needed for Swelling or weight of 3 lbs Over Night and 5 lbs. In a Week.     hydrocortisone 2.5 % cream Apply to scaly itchy areas of face once daily as needed     levothyroxine (SYNTHROID, LEVOTHROID) 50 MCG tablet Take 50 mcg by mouth daily before breakfast. Except Wednesdays and Sundays.     lisinopril-hydrochlorothiazide (PRINZIDE,ZESTORETIC) 20-25 MG per tablet Take 1 tablet by mouth daily.     LUMIGAN 0.01 % SOLN Place 1 drop into the left eye at bedtime.     metoprolol succinate (TOPROL XL) 25 MG 24 hr tablet Take 1 tablet (25 mg total) by mouth daily. 90 tablet 3    Multiple Vitamin (MULTIVITAMIN) tablet Take 1 tablet by mouth daily.     Netarsudil Dimesylate (RHOPRESSA) 0.02 % SOLN Place 1 drop into both eyes at bedtime.     rosuvastatin (CRESTOR) 20 MG tablet Take 20 mg by mouth daily.     Vibegron (GEMTESA) 75 MG TABS Take 75 mg by mouth. Take 1 Tablet daily     RHOPRESSA 0.02 % SOLN      TRAVATAN Z 0.004 % SOLN ophthalmic solution Place 1 drop into both eyes at bedtime. (Patient not taking: Reported on 07/07/2023)     No facility-administered medications prior to visit.     Allergies:   Niacin   Social History   Socioeconomic History   Marital status: Married    Spouse name: Not on file   Number of children: Not on file   Years of education: Not on file   Highest education level: Not on file  Occupational History   Not on file  Tobacco Use   Smoking status: Never   Smokeless tobacco: Never  Substance and Sexual Activity   Alcohol use: No   Drug use: No   Sexual activity: Not on file  Other Topics Concern   Not on file  Social History Narrative   Not on file   Social Drivers of Health   Financial Resource Strain: Not on file  Food Insecurity: Not on file  Transportation Needs: Not on file  Physical Activity: Not on file  Stress: Not on file  Social Connections: Not on file     Family History:  The patient's family history includes Diabetes in her father; Heart failure in her father and mother; Kidney disease in her mother.   ROS:   Please see the history of present illness.    ROS All other systems are reviewed and are negative  PHYSICAL EXAM:   VS:  BP 133/60   Pulse 61   Ht 4\' 11"  (1.499 m)   Wt 183 lb 12.8 oz (83.4 kg)   SpO2 98%   BMI 37.12 kg/m      General: Alert, oriented x3, no distress, morbidly obese Head: no evidence of trauma, PERRL, EOMI, no exophtalmos or lid lag, no myxedema, no xanthelasma; normal ears, nose and oropharynx Neck: normal jugular venous pulsations and no hepatojugular reflux; brisk  carotid pulses without delay and no carotid bruits Chest: clear to auscultation, no signs of consolidation by percussion or palpation, normal fremitus, symmetrical and full respiratory excursions Cardiovascular: normal position and quality of the apical impulse, regular rhythm, normal first and second heart sounds, 2/6 systolic ejection murmur, no diastolic murmurs, rubs or gallops Abdomen: no tenderness or distention, no masses by palpation, no abnormal pulsatility or arterial bruits, normal bowel sounds, no hepatosplenomegaly Extremities: no clubbing, cyanosis or edema;  2+ radial, ulnar and brachial pulses bilaterally; 2+ right femoral, posterior tibial and dorsalis pedis pulses; 2+ left femoral, posterior tibial and dorsalis pedis pulses; no subclavian or femoral bruits Neurological: grossly nonfocal Psych: Normal mood and affect    Wt Readings from Last 3 Encounters:  07/07/23 183 lb 12.8 oz (83.4 kg)  03/20/23 188 lb (85.3 kg)  03/07/23 188 lb 9.6 oz (85.5 kg)      Studies/Labs Reviewed:   EKG: Personally reviewed the tracing from 03/20/2023 which shows sinus bradycardia with mild first-degree AV block and rightward axis deviation.  EKG Interpretation Date/Time:    Ventricular Rate:    PR Interval:    QRS Duration:    QT Interval:    QTC Calculation:   R Axis:      Text Interpretation:            ECHO 04/11/2023  1. Left ventricular ejection fraction, by estimation, is 65 to 70%. Left  ventricular ejection fraction by 3D volume is 64 %. The left ventricle has  normal function. The left ventricle has no regional wall motion  abnormalities. There is severe asymmetric   left ventricular hypertrophy of the basal-septal segment. Left  ventricular diastolic parameters are indeterminate.   2. Right ventricular systolic function is normal. The right ventricular  size is mildly enlarged. There is normal pulmonary artery systolic  pressure.   3. Left atrial size was severely  dilated.   4. The mitral valve is degenerative. Moderate mitral valve regurgitation.   5. The aortic valve is calcified. Aortic valve regurgitation is mild.  Mild aortic valve stenosis. Aortic regurgitation PHT measures 503 msec.  Aortic valve mean gradient measures 19.0 mmHg. Aortic valve Vmax measures  2.87 m/s.   6. The inferior vena cava is normal in size with greater than 50%  respiratory variability, suggesting right atrial pressure of 3 mmHg.   Comparison(s): Prior images unable to be directly viewed, comparison made  by report only. Aortic regurgitation is new from 2020 reported, slight  increase in aortic valve gradients, mitral valve regurgitation has  increased.   Lipid Panel  November 28, 2018 total cholesterol 301, HDL 86, LDL 193, triglycerides 101 December 27, 2019 total cholesterol 188, HDL 74, LDL 100, triglycerides 59 16/03/9603 cholesterol 174, HDL 66, LDL 94, triglycerides 48  Labs November 28, 2018 hemoglobin A1c 5.7%, potassium 5.3, normal TSH and liver function tests December 24, 2018 creatinine 1.94 September 09, 2019 creatinine 1.54 December 27, 2019 hemoglobin 10.6, hemoglobin A1c 6%, normal liver function tests, potassium 4.5 12/08/2022 hemoglobin A1c 6.2%, potassium 5.3, ALT 13 12/20/2022 hemoglobin 9.3, creatinine 1.5. 03/20/2023 creatinine 1.32, potassium 4.8, hemoglobin 11.1  ASSESSMENT:    1. Paroxysmal atrial fibrillation (HCC)   2. Acquired thrombophilia (HCC)   3. Aortic valve stenosis, nonrheumatic   4. Essential hypertension   5. Mixed hyperlipidemia   6. Severe obesity (BMI 35.0-39.9) with comorbidity (HCC)   7. Sinus bradycardia   8. Stage 3b chronic kidney disease (HCC)        PLAN:  In order of problems listed above:  AFib: Asymptomatic, relatively low prevalence, appropriately anticoagulated.  .  CHA2DS2-VASc score 4-5 (age 36, gender, HTN, +/- prediabetes).  Will have her wear an event monitor for couple of weeks to more precisely estimate the prevalence  of atrial fibrillation and to assess the adequacy of rate control with the current dose of metoprolol.  Unfortunately, not a lot of room to provide additional rate control medications due to sinus  bradycardia.  Digoxin is probably best avoided in view of her age and renal dysfunction. Anticoagulation:  Eliquis 2.5 mg twice daily (adjusted for age and renal dysfunction), no bleeding complications AS: Asymptomatic, mild by echocardiogram October 2024 HTN: Adequate control. HLP: All parameters in acceptable range.  She does not have known CAD or PAD. Obesity: She has gradually lost a little bit of weight but remains severely obese.  She does not have any symptoms that would suggest obstructive sleep apnea Sinus bradycardia: Has evidence of conduction system disease with borderline sinus bradycardia and first-degree AV block.  In the past this was exacerbated by beta-blockers and glaucoma eyedrops, but so fast she seems to be tolerating the low-dose of oral metoprolol succinate.  It is almost certain that eventually she will need a pacemaker at some point, if she has symptomatic bradycardia.  At this point she remains asymptomatic and a pacemaker is not necessary. CKD 3B: Stable renal function (actually improved on most recent labs).  Followed by Dr. Allena Dixon.  Has a tendency to hyperkalemia.  She is well educated about the need to avoid NSAIDs and other nephrotoxic agents.   Medication Adjustments/Labs and Tests Ordered: Current medicines are reviewed at length with the patient today.  Concerns regarding medicines are outlined above.  Medication changes, Labs and Tests ordered today are listed in the Patient Instructions below. Patient Instructions  Medication Instructions:  No changes *If you need a refill on your cardiac medications before your next appointment, please call your pharmacy*  Follow-Up: At [facility], you and your health needs are our priority.  As part of our continuing  mission to provide you with exceptional heart care, we have created designated Provider Care Teams.  These Care Teams include your primary Cardiologist (physician) and Advanced Practice Providers (APPs -  Physician Assistants and Nurse Practitioners) who all work together to provide you with the care you need, when you need it.  We recommend signing up for the patient portal called "MyChart".  Sign up information is provided on this After Visit Summary.  MyChart is used to connect with patients for Virtual Visits (Telemedicine).  Patients are able to view lab/test results, encounter notes, upcoming appointments, etc.  Non-urgent messages can be sent to your provider as well.   To learn more about what you can do with MyChart, go to [email or web address].    Your next appointment:   1 year(s)  Provider:   Thurmon Fair, MD               Signed, Alexandra Fair, MD  07/08/2023 7:45 PM    [facility] [address] [place], [place], [place]  [address] Phone: [phone]; Fax: [phone]

## 2023-07-07 NOTE — Patient Instructions (Signed)

## 2023-07-08 ENCOUNTER — Encounter: Payer: Self-pay | Admitting: Cardiovascular Disease

## 2023-07-13 ENCOUNTER — Telehealth: Payer: Self-pay | Admitting: Cardiovascular Disease

## 2023-07-13 NOTE — Telephone Encounter (Signed)
Pharmacy please advise on holding Eliquis prior to dental implant on #6 tooth scheduled for 08/01/2023. Thank you.

## 2023-07-13 NOTE — Telephone Encounter (Signed)
Low risk for CV complications with the planned procedure

## 2023-07-13 NOTE — Telephone Encounter (Signed)
   Pre-operative Risk Assessment    Patient Name: Alexandra Dixon  DOB: 1935/11/18 MRN: [id number]     Request for Surgical Clearance    Procedure:   Dental Implant on tooth number 6  Date of Surgery:  Clearance 08/01/23                                 Surgeon: Dr. Tonye Becket Surgeon's Group or Practice Name: [facility] Phone number: [phone] Fax number: [phone]   Type of Clearance Requested:   - Medical  - Pharmacy:  Hold Apixaban (Eliquis)     Type of Anesthesia:  None    Additional requests/questions:  Please advise surgeon/provider what medications should be held.  Barbette Reichmann   07/13/2023, 10:43 AM

## 2023-07-13 NOTE — Telephone Encounter (Signed)
Good Morning Dr. Royann Shivers  We have received a surgical clearance request for Ms. Alexandra Dixon for dental implant procedure. They were seen recently in clinic on 07/07/2023. Can you please comment on surgical clearance and guidance on holding Eliquis for upcoming procedure. Please forward you guidance and recommendations to P CV DIV PREOP   Thank you Robin Searing, NP

## 2023-07-14 NOTE — Telephone Encounter (Signed)
   Patient Name: Alexandra Dixon  DOB: [birth year] MRN: [id number]  Primary Cardiologist: Thurmon Fair, MD  Chart reviewed as part of pre-operative protocol coverage. Given past medical history and time since last visit, based on ACC/AHA guidelines, TAI SYFERT is at acceptable risk for the planned procedure without further cardiovascular testing.    Per Dr. Royann Shivers, "Low risk for CV complications with the planned procedure."   Per office protocol, patient can hold Eliquis for 1 days prior to procedure.  Please resume Eliquis as soon as possible postprocedure, at the discretion of the surgeon.   I will route this recommendation to the requesting party via Epic fax function and remove from pre-op pool.  Please call with questions.  Joylene Grapes, NP 07/14/2023, 9:53 AM

## 2023-07-14 NOTE — Telephone Encounter (Signed)
Patient with diagnosis of afib on Eliquis for anticoagulation.    Procedure:  Dental Implant on tooth number 6  Date of procedure: 08/01/23   CHA2DS2-VASc Score = 4   This indicates a 4.8% annual risk of stroke. The patient's score is based upon: CHF History: 0 HTN History: 1 Diabetes History: 0 Stroke History: 0 Vascular Disease History: 0 Age Score: 2 Gender Score: 1  CrCl 41 mL/min Platelet count 190  Per office protocol, patient can hold Eliquis for 1 days prior to procedure.    **This guidance is not considered finalized until pre-operative APP has relayed final recommendations.**

## 2023-07-19 NOTE — Telephone Encounter (Signed)
Dental office called in to see if we could email the clearance to their office. This is generally not our practice, however I will be happy to email notes to their office today. Email address give to [place] today:   [email or web address]

## 2023-08-15 DIAGNOSIS — H401131 Primary open-angle glaucoma, bilateral, mild stage: Secondary | ICD-10-CM | POA: Diagnosis not present

## 2023-08-15 DIAGNOSIS — H2512 Age-related nuclear cataract, left eye: Secondary | ICD-10-CM | POA: Diagnosis not present

## 2023-08-15 DIAGNOSIS — H402221 Chronic angle-closure glaucoma, left eye, mild stage: Secondary | ICD-10-CM | POA: Diagnosis not present

## 2023-08-15 DIAGNOSIS — H02889 Meibomian gland dysfunction of unspecified eye, unspecified eyelid: Secondary | ICD-10-CM | POA: Diagnosis not present

## 2023-08-30 ENCOUNTER — Telehealth: Payer: Self-pay | Admitting: Cardiovascular Disease

## 2023-08-30 MED ORDER — DOXAZOSIN MESYLATE 1 MG PO TABS: 1.0000 mg | ORAL_TABLET | Freq: Every evening | ORAL | 3 refills | Status: DC

## 2023-08-30 NOTE — Telephone Encounter (Signed)
*  STAT* If patient is at the pharmacy, call can be transferred to refill team.   1. Which medications need to be refilled? (please list name of each medication and dose if known)  doxazosin (CARDURA) 1 MG tablet  2. Which pharmacy/location (including street and city if local pharmacy) is medication to be sent to? [id number] [place], [place] - [address] Phone: [phone]  Fax: [phone]     3. Do they need a 30 day or 90 day supply? 90

## 2023-08-30 NOTE — Telephone Encounter (Signed)
 Pt's medication was sent to pt's pharmacy as requested. Confirmation received.

## 2023-09-07 DIAGNOSIS — E6609 Other obesity due to excess calories: Secondary | ICD-10-CM | POA: Diagnosis not present

## 2023-09-07 DIAGNOSIS — N3281 Overactive bladder: Secondary | ICD-10-CM | POA: Diagnosis not present

## 2023-09-07 DIAGNOSIS — E1169 Type 2 diabetes mellitus with other specified complication: Secondary | ICD-10-CM | POA: Diagnosis not present

## 2023-09-07 DIAGNOSIS — I129 Hypertensive chronic kidney disease with stage 1 through stage 4 chronic kidney disease, or unspecified chronic kidney disease: Secondary | ICD-10-CM | POA: Diagnosis not present

## 2023-09-07 DIAGNOSIS — N189 Chronic kidney disease, unspecified: Secondary | ICD-10-CM | POA: Diagnosis not present

## 2023-10-17 DIAGNOSIS — H402222 Chronic angle-closure glaucoma, left eye, moderate stage: Secondary | ICD-10-CM | POA: Diagnosis not present

## 2023-10-17 DIAGNOSIS — H402211 Chronic angle-closure glaucoma, right eye, mild stage: Secondary | ICD-10-CM | POA: Diagnosis not present

## 2023-10-17 DIAGNOSIS — H2522 Age-related cataract, morgagnian type, left eye: Secondary | ICD-10-CM | POA: Diagnosis not present

## 2023-10-17 DIAGNOSIS — Z961 Presence of intraocular lens: Secondary | ICD-10-CM | POA: Diagnosis not present

## 2023-10-17 DIAGNOSIS — H401130 Primary open-angle glaucoma, bilateral, stage unspecified: Secondary | ICD-10-CM | POA: Diagnosis not present

## 2023-11-06 DIAGNOSIS — H401123 Primary open-angle glaucoma, left eye, severe stage: Secondary | ICD-10-CM | POA: Diagnosis not present

## 2023-11-06 DIAGNOSIS — N1832 Chronic kidney disease, stage 3b: Secondary | ICD-10-CM | POA: Diagnosis not present

## 2023-11-06 DIAGNOSIS — E039 Hypothyroidism, unspecified: Secondary | ICD-10-CM | POA: Diagnosis not present

## 2023-11-06 DIAGNOSIS — I480 Paroxysmal atrial fibrillation: Secondary | ICD-10-CM | POA: Diagnosis not present

## 2023-11-06 DIAGNOSIS — G629 Polyneuropathy, unspecified: Secondary | ICD-10-CM | POA: Diagnosis not present

## 2023-11-06 DIAGNOSIS — E669 Obesity, unspecified: Secondary | ICD-10-CM | POA: Diagnosis not present

## 2023-11-06 DIAGNOSIS — I129 Hypertensive chronic kidney disease with stage 1 through stage 4 chronic kidney disease, or unspecified chronic kidney disease: Secondary | ICD-10-CM | POA: Diagnosis not present

## 2023-11-06 DIAGNOSIS — H2512 Age-related nuclear cataract, left eye: Secondary | ICD-10-CM | POA: Diagnosis not present

## 2023-11-06 DIAGNOSIS — E785 Hyperlipidemia, unspecified: Secondary | ICD-10-CM | POA: Diagnosis not present

## 2023-12-14 ENCOUNTER — Emergency Department (HOSPITAL_BASED_OUTPATIENT_CLINIC_OR_DEPARTMENT_OTHER)
Admission: EM | Admit: 2023-12-14 | Discharge: 2023-12-14 | Disposition: A | Discharge status: Home / Self Care | Attending: Emergency Medicine | Admitting: Emergency Medicine

## 2023-12-14 ENCOUNTER — Emergency Department (HOSPITAL_BASED_OUTPATIENT_CLINIC_OR_DEPARTMENT_OTHER)

## 2023-12-14 ENCOUNTER — Other Ambulatory Visit: Payer: Self-pay

## 2023-12-14 ENCOUNTER — Encounter (HOSPITAL_BASED_OUTPATIENT_CLINIC_OR_DEPARTMENT_OTHER): Payer: Self-pay

## 2023-12-14 DIAGNOSIS — S00511A Abrasion of lip, initial encounter: Secondary | ICD-10-CM | POA: Diagnosis not present

## 2023-12-14 DIAGNOSIS — E041 Nontoxic single thyroid nodule: Secondary | ICD-10-CM | POA: Diagnosis not present

## 2023-12-14 DIAGNOSIS — Z7901 Long term (current) use of anticoagulants: Secondary | ICD-10-CM | POA: Diagnosis not present

## 2023-12-14 DIAGNOSIS — S0993XA Unspecified injury of face, initial encounter: Secondary | ICD-10-CM | POA: Diagnosis present

## 2023-12-14 DIAGNOSIS — E039 Hypothyroidism, unspecified: Secondary | ICD-10-CM | POA: Insufficient documentation

## 2023-12-14 DIAGNOSIS — W01198A Fall on same level from slipping, tripping and stumbling with subsequent striking against other object, initial encounter: Secondary | ICD-10-CM | POA: Diagnosis not present

## 2023-12-14 DIAGNOSIS — H538 Other visual disturbances: Secondary | ICD-10-CM | POA: Insufficient documentation

## 2023-12-14 DIAGNOSIS — S0083XA Contusion of other part of head, initial encounter: Secondary | ICD-10-CM | POA: Diagnosis not present

## 2023-12-14 DIAGNOSIS — Z79899 Other long term (current) drug therapy: Secondary | ICD-10-CM | POA: Diagnosis not present

## 2023-12-14 DIAGNOSIS — I10 Essential (primary) hypertension: Secondary | ICD-10-CM | POA: Insufficient documentation

## 2023-12-14 DIAGNOSIS — S199XXA Unspecified injury of neck, initial encounter: Secondary | ICD-10-CM | POA: Diagnosis not present

## 2023-12-14 DIAGNOSIS — W19XXXA Unspecified fall, initial encounter: Secondary | ICD-10-CM

## 2023-12-14 NOTE — ED Triage Notes (Addendum)
 Patient states tripped and fell. Hit head and lip on deck. Hematoma noted above left eye. Concerned because she just had glaucoma surgery in same eye. Patient takes Eliquis 

## 2023-12-14 NOTE — ED Provider Notes (Signed)
 D/c if imaging negative. Physical Exam  BP (!) 141/49 (BP Location: Right Arm)   Pulse 79   Temp 98 F (36.7 C)   Resp 16   SpO2 95%   Physical Exam  Procedures  Procedures  ED Course / MDM    Medical Decision Making Amount and/or Complexity of Data Reviewed Radiology: ordered.   CT imaging negative for any intracranial injury, no cervical spine fracture or traumatic injury, no appearance of facial fractures. Patient discharged in stable condition.       Armenta Canning, MD 01/02/24 1510

## 2023-12-14 NOTE — Discharge Instructions (Addendum)
 While you were in the emergency room, you had CT scans done of your head, neck and face.  The scans were all negative.  You can take Tylenol for pain at home.  Please follow-up with your primary care doctor as needed.  Return to the emergency room if develop difficulty with your vision or lose consciousness.  Follow-up with your ophthalmologist as needed for your eye issues.

## 2023-12-14 NOTE — ED Provider Notes (Signed)
 [facility] Provider Note  CSN: [id number] Arrival date & time: 12/14/23 1314  Chief Complaint(s) Fall  HPI Alexandra Dixon is a 88 y.o. female who is here today after she had a trip and fall.  Patient takes Eliquis  for atrial fibrillation, says she was walking up the stairs   Past Medical History Past Medical History:  Diagnosis Date   Glaucoma    Heart murmur    Hyperlipemia    Hypothyroidism    Obesity    Sinus bradycardia    Systemic hypertension    Patient Active Problem List   Diagnosis Date Noted   First degree AV block 07/22/2015   Left carotid bruit 03/19/2014   Bradycardia 12/31/2012   Morbid obesity (HCC) 12/31/2012   Hyperlipidemia 12/31/2012   Glaucoma 12/31/2012   Essential hypertension 12/31/2012   Home Medication(s) Prior to Admission medications   Medication Sig Start Date End Date Taking? Authorizing Provider  prednisoLONE acetate (PRED FORTE) 1 % ophthalmic suspension Apply 1 drop to eye 4 (four) times daily. 11/07/23  Yes [provider]  amLODipine  (NORVASC ) 2.5 MG tablet Take 1 tablet (2.5 mg total) by mouth daily. 06/25/21 07/07/23  Daneen Damien BROCKS, NP  apixaban  (ELIQUIS ) 2.5 MG TABS tablet Take 1 tablet (2.5 mg total) by mouth 2 (two) times daily. 03/07/23   Croitoru, Mihai, MD  dorzolamide (TRUSOPT) 2 % ophthalmic solution Place 1 drop into both eyes 2 (two) times daily. 01/16/20   [provider]  doxazosin  (CARDURA ) 1 MG tablet Take 1 tablet (1 mg total) by mouth at bedtime. 08/30/23   Croitoru, Mihai, MD  ferrous sulfate 325 (65 FE) MG EC tablet Take 325 mg by mouth daily.    [provider]  FLUOCINOLONE ACETONIDE SCALP 0.01 % OIL Apply to scalp nightly in areas of scale and itch. 10/20/14   [provider]  furosemide (LASIX) 20 MG tablet 20 mg as needed. Take 1 Tablet Daily As Needed for Swelling or weight of 3 lbs Over Night and 5 lbs. In a Week. 05/01/19   [provider]  hydrocortisone 2.5 % cream Apply to scaly itchy areas of face once daily as needed 10/20/14   [provider]  levothyroxine (SYNTHROID, LEVOTHROID) 50 MCG tablet Take 50 mcg by mouth daily before breakfast. Except Wednesdays and Sundays.    [provider]  lisinopril-hydrochlorothiazide (PRINZIDE,ZESTORETIC) 20-25 MG per tablet Take 1 tablet by mouth daily.    [provider]  LUMIGAN 0.01 % SOLN Place 1 drop into the left eye at bedtime. 01/23/23   [provider]  metoprolol  succinate (TOPROL  XL) 25 MG 24 hr tablet Take 1 tablet (25 mg total) by mouth daily. 02/28/23   [place], Peter M, MD  Multiple Vitamin (MULTIVITAMIN) tablet Take 1 tablet by mouth daily.    [provider]  Netarsudil Dimesylate (RHOPRESSA) 0.02 % SOLN Place 1 drop into both eyes at bedtime.    [provider]  rosuvastatin (CRESTOR) 20 MG tablet Take 20 mg by mouth daily.    [provider]  Vibegron (GEMTESA) 75 MG TABS Take 75 mg by mouth. Take 1 Tablet daily    [provider]  Past Surgical History Past Surgical History:  Procedure Laterality Date   NM MYOVIEW  LTD  07/27/2006   low risk, cannot exclude subtle anteroapical ischemia   REPLACEMENT TOTAL KNEE  08/21/2006   left knee and right knee   US  ECHOCARDIOGRAPHY  06/25/2007   mild LVH,LA mildly dilated,mild to mod mitral annular ca+,mild MR,mild to mod TR,AOV mildly sclerotic,aortic root sclerosis   Family History Family History  Problem Relation Age of Onset   Heart failure Mother    Kidney disease Mother    Heart failure Father    Diabetes Father     Social History Social History   Tobacco Use   Smoking status: Never   Smokeless tobacco: Never  Substance Use Topics   Alcohol use: No   Drug use: No   Allergies Niacin  Review of  Systems Review of Systems  Physical Exam Vital Signs  I have reviewed the triage vital signs BP (!) 141/49 (BP Location: Right Arm)   Pulse 79   Temp 98 F (36.7 C)   Resp 16   SpO2 95%   Physical Exam Vitals and nursing note reviewed.  HENT:     Head: Normocephalic and atraumatic.     Nose: Nose normal.     Mouth/Throat:     Comments: Abrasion on the lower lip without any laceration.  No loose or missing teeth  Eyes:     Pupils: Pupils are equal, round, and reactive to light.     Comments: Vision grossly normal in both eyes.  No conjunctival erythema  Left eye periorbital edema   Cardiovascular:     Rate and Rhythm: Normal rate and regular rhythm.     Pulses: Normal pulses.  Pulmonary:     Effort: Pulmonary effort is normal.  Abdominal:     General: Abdomen is flat.     Palpations: Abdomen is soft.   Musculoskeletal:     Cervical back: Normal range of motion.     Comments: No tenderness to palpation in the bilateral shoulders, upper arms, elbows, forearms or wrists.  No tenderness to palpation in the chest.  Pelvis stable, nontender.  No tenderness, deformities noted on bilateral upper legs, knees, lower legs or ankles.  Patient able to lift both legs from the bed.   Skin:    General: Skin is warm and dry.   Neurological:     General: No focal deficit present.     Mental Status: She is alert.     ED Results and Treatments Labs (all labs ordered are listed, but only abnormal results are displayed) Labs Reviewed - No data to display                                                                                                                        Radiology No results found.  Pertinent labs & imaging results that were available during my care of the patient were reviewed by me and considered in my medical decision making (see  MDM for details).  Medications Ordered in ED Medications - No data to display                                                                                                                                    Procedures Procedures  (including critical care time)  Medical Decision Making / ED Course   This patient presents to the ED for concern of fall, this involves an extensive number of treatment options, and is a complaint that carries with it a high risk of complications and morbidity.  The differential diagnosis includes ICH, head contusion, orbital fracture.  MDM: Patient overall well-appearing on exam.  She has no neurological deficits.  She ambulated into the emergency room without any difficulty.  Normal strength and sensation in all extremities.  Patient primarily concerned about striking above her left eye, and her recent surgery.  Her vision is unchanged in that eye.  She has had some blurred vision since surgery, followed up with her ophthalmologist yesterday.  In the absence of trouble with her vision, pain or redness in the eye, unlikely that she has suffered any injury to the surgical area.  Will obtain imaging of patient's head, neck, max face.  Considered labs in this patient, however this was a mechanical fall, she had been feeling well prior, and has no complaints at this time.   Reassessment 3:10 PM-patient still pending CT imaging.  She has been signed out to Dr. Ismael pending CT results.   Reevaluation: After the interventions noted above, I reevaluated the patient and found that they have :improved  Co morbidities that complicate the patient evaluation  Past Medical History:  Diagnosis Date   Glaucoma    Heart murmur    Hyperlipemia    Hypothyroidism    Obesity    Sinus bradycardia    Systemic hypertension        Final Clinical Impression(s) / ED Diagnoses Final diagnoses:  Fall, initial encounter  Contusion of face, initial encounter     @PCDICTATION @    Mannie Pac T, DO 12/14/23 1510

## 2023-12-27 DIAGNOSIS — N390 Urinary tract infection, site not specified: Secondary | ICD-10-CM | POA: Diagnosis not present

## 2023-12-27 DIAGNOSIS — N1832 Chronic kidney disease, stage 3b: Secondary | ICD-10-CM | POA: Diagnosis not present

## 2023-12-29 ENCOUNTER — Encounter: Payer: Self-pay | Admitting: Cardiovascular Disease

## 2023-12-29 NOTE — Telephone Encounter (Signed)
 Called patient and she advises that she has been noticing puffiness in her face, swelling in her ankles, and increasing low energy. Pt reports that she has been having puffiness in her face even before her fall on 12/14/23. Pt reports that her heart rate is running around 49, 50, and 52. Pt states that she does not feel that she is currently in A. Fib. Declines palpitations and just reports that heart rate feels slow. Pt denies recently gaining weight and has not been having take her lasix in the last week. Spoke with Dr. Ladona (DOD) and advised to send message to Dr. Francyne to determine if referral is needed to EP or not after review of result note from heart monitor performed in 2024. Appointment has been made for patient on 01/05/24. ED precautions reviewed with patient.

## 2024-01-04 DIAGNOSIS — N1832 Chronic kidney disease, stage 3b: Secondary | ICD-10-CM | POA: Diagnosis not present

## 2024-01-04 DIAGNOSIS — D631 Anemia in chronic kidney disease: Secondary | ICD-10-CM | POA: Diagnosis not present

## 2024-01-04 DIAGNOSIS — N2581 Secondary hyperparathyroidism of renal origin: Secondary | ICD-10-CM | POA: Diagnosis not present

## 2024-01-04 DIAGNOSIS — I129 Hypertensive chronic kidney disease with stage 1 through stage 4 chronic kidney disease, or unspecified chronic kidney disease: Secondary | ICD-10-CM | POA: Diagnosis not present

## 2024-01-04 NOTE — Progress Notes (Signed)
 Patient ID: Alexandra Dixon, female   DOB: 20-Feb-1936, 88 y.o.   MRN: [id number]     Cardiology Office Note    Date:  01/13/2024   ID:  Alexandra Dixon, DOB 10-Jan-1936, MRN [id number]  PCP:  Benjamine Aland, MD  Cardiologist: Jerel Balding, MD   No chief complaint on file.   History of Present Illness:  Alexandra Dixon is a 88 y.o. female with morbid obesity, treated systemic hypertension (with LVH) , mild AS (echo Sep 2020),  hypothyroidism, hyperlipidemia on statin therapy and mild to moderate tricuspid insufficiency, first-degree AV block, who also has CKD stage III, glaucoma.   She tripped and fell climbing the stairs at home, struck her left orbital area and was evaluated in the emergency room on 12/14/2023.  Maxillofacial and head CT did not show any serious injuries or bleeding, but was significant for the presence of arterial atherosclerosis and degenerative changes in the cervical spine.  Last week she noticed some puffiness in her face and ankles as well as fatigue, lack of energy.  She reported that her heart rate has been running in the 49-50 range, but did not have palpitations and did not feel that she was in atrial fibrillation.  Last October an arrhythmia monitor showed that she had periods of paroxysmal atrial fibrillation rapid ventricular response (up to 160 bpm) as well as periods of  sinus bradycardia with rates as low as 30 bpm.  The burden of atrial fibrillation was low at only about 1% and the arrhythmia was asymptomatic, so no intervention was made at that time.  She is still taking metoprolol  succinate 25 mg once daily.  She is not on other medications with negative chronotropic affect. In the past, she was intolerant of glaucoma eyedrops continue beta-blockers due to bradycardia down to the 40s.   She denies angina at rest or with activity and does not complain of shortness of breath.  Has not orthopnea or PND.  She lives independently and takes care of her household chores  without assistance.  Her atrial fibrillation was initially detected by her smart watch, confirmed by an ECG performed in her primary care doctor's office and shown to have low prevalence by the arrhythmia monitor last October.  She has never been aware of the arrhythmia.  Most recent echocardiogram from October 2024 shows normal LV function, a severely dilated left atrium, moderate mitral valve insufficiency, mild-moderate aortic valve stenosis (mean gradient 19 mmHg) and mild aortic insufficiency.  The dimensionless valve index was 0.34.  She has moderate stable renal insufficiency with a creatinine of 1.47 (estimated GFR 34, her nephrologist is Dr. Tobie) and a mild normocytic anemia with a hemoglobin that is stable in the 9-10 range.  Glycemic control is good with a recent hemoglobin A1c of 5.8%.  However, her most recent lipid profile from March showed an LDL cholesterol of 185 (albeit with an excellent HDL cholesterol of 75 and normal triglycerides).  Not sure what happened, but it appears that when her cholesterol numbers were drawn in March she was not taking rosuvastatin.     Past Medical History:  Diagnosis Date   Glaucoma    Heart murmur    Hyperlipemia    Hypothyroidism    Obesity    Sinus bradycardia    Systemic hypertension     Past Surgical History:  Procedure Laterality Date   NM MYOVIEW  LTD  07/27/2006   low risk, cannot exclude subtle anteroapical ischemia   REPLACEMENT TOTAL KNEE  08/21/2006   left knee and right knee   US  ECHOCARDIOGRAPHY  06/25/2007   mild LVH,LA mildly dilated,mild to mod mitral annular ca+,mild MR,mild to mod TR,AOV mildly sclerotic,aortic root sclerosis    Outpatient Medications Prior to Visit  Medication Sig Dispense Refill   amLODipine  (NORVASC ) 2.5 MG tablet Take 1 tablet (2.5 mg total) by mouth daily. 180 tablet 3   apixaban  (ELIQUIS ) 2.5 MG TABS tablet Take 1 tablet (2.5 mg total) by mouth 2 (two) times daily. 60 tablet 11   dorzolamide  (TRUSOPT) 2 % ophthalmic solution Place 1 drop into both eyes 2 (two) times daily.     FLUOCINOLONE ACETONIDE SCALP 0.01 % OIL Apply to scalp nightly in areas of scale and itch.     furosemide (LASIX) 20 MG tablet 20 mg as needed. Take 1 Tablet Daily As Needed for Swelling or weight of 3 lbs Over Night and 5 lbs. In a Week.     furosemide (LASIX) 20 MG tablet Take 20 mg by mouth every other day.     hydrocortisone 2.5 % cream Apply to scaly itchy areas of face once daily as needed     levothyroxine (SYNTHROID, LEVOTHROID) 50 MCG tablet Take 50 mcg by mouth daily before breakfast. Except Wednesdays and Sundays.     lisinopril-hydrochlorothiazide (PRINZIDE,ZESTORETIC) 20-25 MG per tablet Take 1 tablet by mouth daily.     metoprolol  succinate (TOPROL  XL) 25 MG 24 hr tablet Take 1 tablet (25 mg total) by mouth daily. 90 tablet 3   Multiple Vitamin (MULTIVITAMIN) tablet Take 1 tablet by mouth daily.     rosuvastatin (CRESTOR) 20 MG tablet Take 20 mg by mouth daily.     Vibegron (GEMTESA) 75 MG TABS Take 75 mg by mouth. Take 1 Tablet daily     doxazosin  (CARDURA ) 1 MG tablet Take 1 tablet (1 mg total) by mouth at bedtime. 90 tablet 3   ferrous sulfate 325 (65 FE) MG EC tablet Take 325 mg by mouth daily.     LUMIGAN 0.01 % SOLN Place 1 drop into the left eye at bedtime.     Netarsudil Dimesylate (RHOPRESSA) 0.02 % SOLN Place 1 drop into both eyes at bedtime.     prednisoLONE acetate (PRED FORTE) 1 % ophthalmic suspension Apply 1 drop to eye 4 (four) times daily.     No facility-administered medications prior to visit.     Allergies:   Niacin   PHYSICAL EXAM:   VS:  BP (!) 138/58 (BP Location: Left Arm, Patient Position: Sitting, Cuff Size: Large)   Pulse (!) 57   Ht 4' 11 (1.499 m)   Wt 185 lb (83.9 kg)   SpO2 99%   BMI 37.37 kg/m      General: Alert, oriented x3, no distress, severely obese Head: no evidence of trauma, PERRL, EOMI, no exophtalmos or lid lag, no myxedema, no xanthelasma;  normal ears, nose and oropharynx Neck: normal jugular venous pulsations and no hepatojugular reflux; brisk carotid pulses without delay and no carotid bruits Chest: clear to auscultation, no signs of consolidation by percussion or palpation, normal fremitus, symmetrical and full respiratory excursions Cardiovascular: normal position and quality of the apical impulse, regular rhythm, normal first and second heart sounds, no murmurs, rubs or gallops Abdomen: no tenderness or distention, no masses by palpation, no abnormal pulsatility or arterial bruits, normal bowel sounds, no hepatosplenomegaly Extremities: no clubbing, cyanosis or edema; 2+ radial, ulnar and brachial pulses bilaterally; 2+ right femoral, posterior tibial and dorsalis  pedis pulses; 2+ left femoral, posterior tibial and dorsalis pedis pulses; no subclavian or femoral bruits Neurological: grossly nonfocal Psych: Normal mood and affect     Wt Readings from Last 3 Encounters:  01/05/24 185 lb (83.9 kg)  07/07/23 183 lb 12.8 oz (83.4 kg)  03/20/23 188 lb (85.3 kg)      Studies/Labs Reviewed:   EKG:   EKG Interpretation Date/Time:  Friday January 05 2024 13:25:29 EDT Ventricular Rate:  57 PR Interval:  226 QRS Duration:  78 QT Interval:  406 QTC Calculation: 395 R Axis:   28  Text Interpretation: Sinus bradycardia with 1st degree A-V block When compared with ECG of 20-Mar-2023 01:57, No significant change since last tracing Confirmed by Arieon Corcoran ([id number]) on 01/05/2024 1:35:11 PM          ECHO 04/11/2023  1. Left ventricular ejection fraction, by estimation, is 65 to 70%. Left  ventricular ejection fraction by 3D volume is 64 %. The left ventricle has  normal function. The left ventricle has no regional wall motion  abnormalities. There is severe asymmetric   left ventricular hypertrophy of the basal-septal segment. Left  ventricular diastolic parameters are indeterminate.   2. Right ventricular systolic  function is normal. The right ventricular  size is mildly enlarged. There is normal pulmonary artery systolic  pressure.   3. Left atrial size was severely dilated.   4. The mitral valve is degenerative. Moderate mitral valve regurgitation.   5. The aortic valve is calcified. Aortic valve regurgitation is mild.  Mild aortic valve stenosis. Aortic regurgitation PHT measures 503 msec.  Aortic valve mean gradient measures 19.0 mmHg. Aortic valve Vmax measures  2.87 m/s.   6. The inferior vena cava is normal in size with greater than 50%  respiratory variability, suggesting right atrial pressure of 3 mmHg.   Comparison(s): Prior images unable to be directly viewed, comparison made  by report only. Aortic regurgitation is new from 2020 reported, slight  increase in aortic valve gradients, mitral valve regurgitation has  increased.   Lipid Panel  November 28, 2018 total cholesterol 301, HDL 86, LDL 193, triglycerides 101 December 27, 2019 total cholesterol 188, HDL 74, LDL 100, triglycerides 59 93/79/7975 cholesterol 174, HDL 66, LDL 94, triglycerides 48 09/07/2023 cholesterol 278, HDL 75, LDL 185, triglycerides 75  Labs November 28, 2018 hemoglobin A1c 5.7%, potassium 5.3, normal TSH and liver function tests December 24, 2018 creatinine 1.94 September 09, 2019 creatinine 1.54 December 27, 2019 hemoglobin 10.6, hemoglobin A1c 6%, normal liver function tests, potassium 4.5 12/08/2022 hemoglobin A1c 6.2%, potassium 5.3, ALT 13 12/20/2022 hemoglobin 9.3, creatinine 1.5. 03/20/2023 creatinine 1.32, potassium 4.8, hemoglobin 11.1 12/27/2023 creatinine 1.31, potassium 5.5, hemoglobin 9.5, MCV 93 09/07/2023 Hemoglobin A1c 5.8%, hemoglobin 10.3, creatinine 1.3, potassium 5.8, ALT 14  ASSESSMENT:    1. Tachycardia-bradycardia syndrome (HCC)   2. Paroxysmal atrial fibrillation (HCC)   3. Acquired thrombophilia (HCC)   4. Aortic valve stenosis, nonrheumatic   5. Essential hypertension   6. Mixed hyperlipidemia   7.  Severe obesity (BMI 35.0-39.9) with comorbidity (HCC)   8. Stage 3b chronic kidney disease (HCC)   9. Anemia, unspecified type   10. Acquired hypothyroidism    PLAN:  In order of problems listed above:  Sinus bradycardia: Has evidence of conduction system disease with borderline sinus bradycardia and first-degree AV block, but she has not had any severe symptoms of bradycardia.  In the past this was exacerbated by beta-blockers and glaucoma eyedrops, but so  far she seems to be tolerating the low-dose of oral metoprolol  succinate.  It is likely that at some point in the future she will need a pacemaker for symptomatic bradycardia but as long as she remains asymptomatic we can defer that.  AFib: Low prevalence (only 1% on rhythm monitor in October 2024) and asymptomatic.  She is appropriately anticoagulated with apixaban .   CHA2DS2-VASc score 4-5 (age 23, gender, HTN, +/- prediabetes).   Anticoagulation:  Eliquis  2.5 mg twice daily (adjusted for age and renal dysfunction), without any bleeding complications AS: Remains asymptomatic.  Appears mild to moderate on the most recent echocardiogram HTN: Very good control.  Avoid diastolic blood pressure less than 60. Target SBP less than 140.   Stop doxazosin .   HLP: Most recent LDL cholesterol is very high, probably due to an interruption in lipid-lowering therapy.  Recommend continuing to take rosuvastatin 20 mg daily. Obesity: Does not have hypersomnolence or other symptoms of obstructive sleep apnea.  Would recommend weight loss attempts. CKD 3B: Small change in creatinine level.  She follows with Dr. Tobie.  She has a tendency to develop hyperkalemia.  Aware of the need to avoid NSAIDs or other nephrotoxic drugs. Anemia: Mild normocytic normochromic anemia probably due to renal insufficiency, no overt active bleeding or iron deficiency. Hypothyroidism: Most recent TSH was normal range.  She does not appear clinically hypothyroid.   Medication  Adjustments/Labs and Tests Ordered: Current medicines are reviewed at length with the patient today.  Concerns regarding medicines are outlined above.  Medication changes, Labs and Tests ordered today are listed in the Patient Instructions below. Patient Instructions  Medication Instructions:  Stop Doxazosin  *If you need a refill on your cardiac medications before your next appointment, please call your pharmacy*  Please keep a BP and Heart Rate log for 2 weeks and send through MyChart.  Follow-Up: At [facility], you and your health needs are our priority.  As part of our continuing mission to provide you with exceptional heart care, our providers are all part of one team.  This team includes your primary Cardiologist (physician) and Advanced Practice Providers or APPs (Physician Assistants and Nurse Practitioners) who all work together to provide you with the care you need, when you need it.  Your next appointment:   1 year(s)  Provider:   Jerel Balding, MD    We recommend signing up for the patient portal called MyChart.  Sign up information is provided on this After Visit Summary.  MyChart is used to connect with patients for Virtual Visits (Telemedicine).  Patients are able to view lab/test results, encounter notes, upcoming appointments, etc.  Non-urgent messages can be sent to your provider as well.   To learn more about what you can do with MyChart, go to [email or web address].            Signed, Jerel Balding, MD  01/13/2024 6:06 PM    [facility] [address] [place], [place], [place]  [address] Phone: [phone]; Fax: [phone]

## 2024-01-05 ENCOUNTER — Encounter: Payer: Self-pay | Admitting: Cardiovascular Disease

## 2024-01-05 ENCOUNTER — Ambulatory Visit: Attending: Cardiovascular Disease | Admitting: Cardiovascular Disease

## 2024-01-05 ENCOUNTER — Telehealth: Payer: Self-pay | Admitting: Emergency Medicine

## 2024-01-05 VITALS — BP 138/58 | HR 57 | Ht 59.0 in | Wt 185.0 lb

## 2024-01-05 DIAGNOSIS — I350 Nonrheumatic aortic (valve) stenosis: Secondary | ICD-10-CM

## 2024-01-05 DIAGNOSIS — I495 Sick sinus syndrome: Secondary | ICD-10-CM | POA: Diagnosis not present

## 2024-01-05 DIAGNOSIS — D6869 Other thrombophilia: Secondary | ICD-10-CM | POA: Diagnosis not present

## 2024-01-05 DIAGNOSIS — E782 Mixed hyperlipidemia: Secondary | ICD-10-CM | POA: Diagnosis not present

## 2024-01-05 DIAGNOSIS — E039 Hypothyroidism, unspecified: Secondary | ICD-10-CM

## 2024-01-05 DIAGNOSIS — D649 Anemia, unspecified: Secondary | ICD-10-CM | POA: Diagnosis not present

## 2024-01-05 DIAGNOSIS — I480 Paroxysmal atrial fibrillation: Secondary | ICD-10-CM

## 2024-01-05 DIAGNOSIS — N1832 Chronic kidney disease, stage 3b: Secondary | ICD-10-CM | POA: Diagnosis not present

## 2024-01-05 DIAGNOSIS — I10 Essential (primary) hypertension: Secondary | ICD-10-CM

## 2024-01-05 NOTE — Telephone Encounter (Signed)
 Fax Requesting most recent lipid panel

## 2024-01-05 NOTE — Patient Instructions (Signed)
 Medication Instructions:  Stop Doxazosin  *If you need a refill on your cardiac medications before your next appointment, please call your pharmacy*  Please keep a BP and Heart Rate log for 2 weeks and send through MyChart.  Follow-Up: At [facility], you and your health needs are our priority.  As part of our continuing mission to provide you with exceptional heart care, our providers are all part of one team.  This team includes your primary Cardiologist (physician) and Advanced Practice Providers or APPs (Physician Assistants and Nurse Practitioners) who all work together to provide you with the care you need, when you need it.  Your next appointment:   1 year(s)  Provider:   Jerel Balding, MD    We recommend signing up for the patient portal called MyChart.  Sign up information is provided on this After Visit Summary.  MyChart is used to connect with patients for Virtual Visits (Telemedicine).  Patients are able to view lab/test results, encounter notes, upcoming appointments, etc.  Non-urgent messages can be sent to your provider as well.   To learn more about what you can do with MyChart, go to [email or web address].

## 2024-01-12 ENCOUNTER — Emergency Department (HOSPITAL_BASED_OUTPATIENT_CLINIC_OR_DEPARTMENT_OTHER)
Admission: EM | Admit: 2024-01-12 | Discharge: 2024-01-12 | Disposition: A | Discharge status: Home / Self Care | Attending: Emergency Medicine | Admitting: Emergency Medicine

## 2024-01-12 ENCOUNTER — Encounter (HOSPITAL_BASED_OUTPATIENT_CLINIC_OR_DEPARTMENT_OTHER): Payer: Self-pay | Admitting: Emergency Medicine

## 2024-01-12 ENCOUNTER — Encounter: Payer: Self-pay | Admitting: Cardiovascular Disease

## 2024-01-12 ENCOUNTER — Emergency Department (HOSPITAL_BASED_OUTPATIENT_CLINIC_OR_DEPARTMENT_OTHER): Admitting: Radiology

## 2024-01-12 ENCOUNTER — Telehealth: Payer: Self-pay | Admitting: Cardiovascular Disease

## 2024-01-12 ENCOUNTER — Other Ambulatory Visit: Payer: Self-pay

## 2024-01-12 DIAGNOSIS — R000 Tachycardia, unspecified: Secondary | ICD-10-CM

## 2024-01-12 DIAGNOSIS — Z79899 Other long term (current) drug therapy: Secondary | ICD-10-CM | POA: Diagnosis not present

## 2024-01-12 DIAGNOSIS — I480 Paroxysmal atrial fibrillation: Secondary | ICD-10-CM | POA: Insufficient documentation

## 2024-01-12 DIAGNOSIS — I4892 Unspecified atrial flutter: Secondary | ICD-10-CM | POA: Insufficient documentation

## 2024-01-12 DIAGNOSIS — R0602 Shortness of breath: Secondary | ICD-10-CM | POA: Diagnosis not present

## 2024-01-12 DIAGNOSIS — I700 Atherosclerosis of aorta: Secondary | ICD-10-CM | POA: Diagnosis not present

## 2024-01-12 DIAGNOSIS — Z7901 Long term (current) use of anticoagulants: Secondary | ICD-10-CM | POA: Insufficient documentation

## 2024-01-12 LAB — BASIC METABOLIC PANEL WITH GFR
Anion gap: 11 (ref 5–15)
BUN: 36 mg/dL — ABNORMAL HIGH (ref 8–23)
CO2: 21 mmol/L — ABNORMAL LOW (ref 22–32)
Calcium: 9.7 mg/dL (ref 8.9–10.3)
Chloride: 107 mmol/L (ref 98–111)
Creatinine, Ser: 1.47 mg/dL — ABNORMAL HIGH (ref 0.44–1.00)
GFR, Estimated: 34 mL/min — ABNORMAL LOW (ref >60)
Glucose, Bld: 127 mg/dL — ABNORMAL HIGH (ref 70–99)
Potassium: 5.4 mmol/L — ABNORMAL HIGH (ref 3.5–5.1)
Sodium: 139 mmol/L (ref 135–145)

## 2024-01-12 LAB — CBC
HCT: 30.5 % — ABNORMAL LOW (ref 36.0–46.0)
Hemoglobin: 10.1 g/dL — ABNORMAL LOW (ref 12.0–15.0)
MCH: 28.5 pg (ref 26.0–34.0)
MCHC: 33.1 g/dL (ref 30.0–36.0)
MCV: 86.2 fL (ref 80.0–100.0)
Platelets: 238 K/uL (ref 150–400)
RBC: 3.54 MIL/uL — ABNORMAL LOW (ref 3.87–5.11)
RDW: 15.3 % (ref 11.5–15.5)
WBC: 5.2 K/uL (ref 4.0–10.5)
nRBC: 0 % (ref 0.0–0.2)

## 2024-01-12 LAB — TROPONIN T, HIGH SENSITIVITY
Troponin T High Sensitivity: 32 ng/L — ABNORMAL HIGH (ref <19)
Troponin T High Sensitivity: 30 ng/L — ABNORMAL HIGH (ref <19)

## 2024-01-12 LAB — PRO BRAIN NATRIURETIC PEPTIDE: Pro Brain Natriuretic Peptide: 60.6 pg/mL (ref <300.0)

## 2024-01-12 LAB — MAGNESIUM: Magnesium: 2.3 mg/dL (ref 1.7–2.4)

## 2024-01-12 MED ORDER — DILTIAZEM HCL-DEXTROSE 125-5 MG/125ML-% IV SOLN (PREMIX)
5.0000 mg/h | INTRAVENOUS | Status: DC
  Administered 2024-01-12: 5 mg/h via INTRAVENOUS
  Filled 2024-01-12: qty 125

## 2024-01-12 MED ORDER — SODIUM ZIRCONIUM CYCLOSILICATE 10 G PO PACK
10.0000 g | PACK | Freq: Once | ORAL | Status: AC
  Administered 2024-01-12: 10 g via ORAL
  Filled 2024-01-12: qty 1

## 2024-01-12 MED ORDER — DILTIAZEM LOAD VIA INFUSION
10.0000 mg | Freq: Once | INTRAVENOUS | Status: AC
  Administered 2024-01-12: 10 mg via INTRAVENOUS
  Filled 2024-01-12: qty 10

## 2024-01-12 NOTE — ED Provider Notes (Signed)
 [place] EMERGENCY DEPARTMENT AT [facility] Provider Note   CSN: [id number] Arrival date & time: 01/12/24  1620     Patient presents with: Tachycardia   Alexandra Dixon is a 88 y.o. female with medical history of heart murmur, hyperlipidemia, hypothyroid, obesity, sinus bradycardia, systemic hypertension, paroxysmal A-fib on Eliquis .  Patient presents to ED for evaluation of tachycardia.  States that around 3 PM today her watch alerted her that her heart rate was high and she states that prior to her watch going off she did feel little bit weak and felt that she had palpitations in her chest.  She denies any chest pain, states she is a little bit short of breath.  Denies any lightheadedness, dizziness but does states she has been weak throughout the course of the day.  Denies any focal weakness.  Denies any leg swelling, nausea, vomiting or abdominal pain.  Reports compliance on Eliquis .  States that she was recently taken off of medication by her cardiologist, chart reviewed and appears that she was taken off metoprolol  due to episodes of bradycardia.   HPI     Prior to Admission medications   Medication Sig Start Date End Date Taking? Authorizing Provider  amLODipine  (NORVASC ) 2.5 MG tablet Take 1 tablet (2.5 mg total) by mouth daily. 06/25/21 01/05/24  Daneen Damien BROCKS, NP  apixaban  (ELIQUIS ) 2.5 MG TABS tablet Take 1 tablet (2.5 mg total) by mouth 2 (two) times daily. 03/07/23   Croitoru, Mihai, MD  dorzolamide (TRUSOPT) 2 % ophthalmic solution Place 1 drop into both eyes 2 (two) times daily. 01/16/20   [provider]  ferrous sulfate 325 (65 FE) MG EC tablet Take 325 mg by mouth daily.    [provider]  FLUOCINOLONE ACETONIDE SCALP 0.01 % OIL Apply to scalp nightly in areas of scale and itch. 10/20/14   [provider]  furosemide (LASIX) 20 MG tablet 20 mg as needed. Take 1 Tablet Daily As Needed for Swelling or weight of 3 lbs Over Night and 5 lbs.  In a Week. 05/01/19   [provider]  furosemide (LASIX) 20 MG tablet Take 20 mg by mouth every other day.    [provider]  hydrocortisone 2.5 % cream Apply to scaly itchy areas of face once daily as needed 10/20/14   [provider]  levothyroxine (SYNTHROID, LEVOTHROID) 50 MCG tablet Take 50 mcg by mouth daily before breakfast. Except Wednesdays and Sundays.    [provider]  lisinopril-hydrochlorothiazide (PRINZIDE,ZESTORETIC) 20-25 MG per tablet Take 1 tablet by mouth daily.    [provider]  LUMIGAN 0.01 % SOLN Place 1 drop into the left eye at bedtime. 01/23/23   [provider]  metoprolol  succinate (TOPROL  XL) 25 MG 24 hr tablet Take 1 tablet (25 mg total) by mouth daily. 02/28/23   [place], Peter M, MD  Multiple Vitamin (MULTIVITAMIN) tablet Take 1 tablet by mouth daily.    [provider]  Netarsudil Dimesylate (RHOPRESSA) 0.02 % SOLN Place 1 drop into both eyes at bedtime.    [provider]  prednisoLONE acetate (PRED FORTE) 1 % ophthalmic suspension Apply 1 drop to eye 4 (four) times daily. 11/07/23   [provider]  rosuvastatin (CRESTOR) 20 MG tablet Take 20 mg by mouth daily.    [provider]  Vibegron (GEMTESA) 75 MG TABS Take 75 mg by mouth. Take 1 Tablet daily    [provider]    Allergies: Niacin  Review of Systems  All other systems reviewed and are negative.   Updated Vital Signs BP 130/80   Pulse (!) 127   Temp 98.3 F (36.8 C) (Oral)   Resp (!) 25   SpO2 98%   Physical Exam Vitals and nursing note reviewed.  Constitutional:      General: She is not in acute distress.    Appearance: She is well-developed.  HENT:     Head: Normocephalic and atraumatic.  Eyes:     Conjunctiva/sclera: Conjunctivae normal.  Cardiovascular:     Rate and Rhythm: Regular rhythm. Tachycardia present.     Heart sounds: No murmur heard. Pulmonary:     Effort: Pulmonary  effort is normal. No respiratory distress.     Breath sounds: Normal breath sounds.  Abdominal:     Palpations: Abdomen is soft.     Tenderness: There is no abdominal tenderness.  Musculoskeletal:        General: No swelling.     Cervical back: Neck supple.     Right lower leg: Edema present.     Left lower leg: Edema present.     Comments: 1+ edema to bilateral lower extremities  Skin:    General: Skin is warm and dry.     Capillary Refill: Capillary refill takes less than 2 seconds.  Neurological:     Mental Status: She is alert and oriented to person, place, and time. Mental status is at baseline.  Psychiatric:        Mood and Affect: Mood normal.     (all labs ordered are listed, but only abnormal results are displayed) Labs Reviewed  BASIC METABOLIC PANEL WITH GFR - Abnormal; Notable for the following components:      Result Value   Potassium 5.4 (*)    CO2 21 (*)    Glucose, Bld 127 (*)    BUN 36 (*)    Creatinine, Ser 1.47 (*)    GFR, Estimated 34 (*)    All other components within normal limits  CBC - Abnormal; Notable for the following components:   RBC 3.54 (*)    Hemoglobin 10.1 (*)    HCT 30.5 (*)    All other components within normal limits  TROPONIN T, HIGH SENSITIVITY - Abnormal; Notable for the following components:   Troponin T High Sensitivity 32 (*)    All other components within normal limits  PRO BRAIN NATRIURETIC PEPTIDE  MAGNESIUM  TROPONIN T, HIGH SENSITIVITY    EKG: None  Radiology: DG Chest 2 View Result Date: 01/12/2024 CLINICAL DATA:  tachycardia EXAM: CHEST - 2 VIEW COMPARISON:  03/20/2023. FINDINGS: The heart size and mediastinal contours are within normal limits for technique. Aortic atherosclerosis. No focal consolidation, pleural effusion, or pneumothorax. Degenerative changes of the thoracic spine. No acute osseous abnormality. IMPRESSION: No acute cardiopulmonary findings. Electronically Signed   By: Harrietta Sherry M.D.   On:  01/12/2024 17:09     Procedures   Medications Ordered in the ED  diltiazem (CARDIZEM) 1 mg/mL load via infusion 10 mg (10 mg Intravenous Bolus from Bag 01/12/24 1659)    And  diltiazem (CARDIZEM) 125 mg in dextrose 5% 125 mL (1 mg/mL) infusion (0 mg/hr Intravenous Paused 01/12/24 1713)  sodium zirconium cyclosilicate (LOKELMA) packet 10 g (10 g Oral Given 01/12/24 1741)    Medical Decision Making Amount and/or Complexity of Data Reviewed Labs: ordered. Radiology: ordered.  Risk Prescription drug management.   88 year old female presents to the ED for evaluation  of tachycardia.  Please see HPI for further details.  On examination patient EKG shows possible atrial flutter with a pulse rate of 130.  There are no P waves however does not appear to be atrial fibrillation.  She is overall hemodynamically stable with a blood pressure of 130/80.  Her neurological examination reveals no focal neurodeficits.  She is afebrile however tachycardic to 130.  Lung sounds are clear bilaterally, no hypoxia.  Abdomen soft and compressible.  Patient chart reviewed.  Patient was recently taken off of metoprolol  due to symptomatic bradycardia that she was experiencing.  Patient is anticoagulated on Eliquis .  Reports her symptoms began today around 3 PM when she was notified that she had a high pulse rate on her Apple Watch.  Here she denies any chest pain or shortness of breath.  Endorsing vague weakness but denies lightheadedness or dizziness.  Patient case was discussed with attending Dr. Darra.  Decision was made to utilize diltiazem for rate control.  Will also collect labs to include CBC, BMP, troponin x 2, BNP, EKG and chest x-ray.  While awaiting patient workup, the patient did convert out of atrial flutter to normal sinus rhythm after receiving diltiazem bolus.  Her pulse rate is currently 65.  She is hemodynamically stable.  Diltiazem was paused at this time.  Patient lab work reveals no leukocytosis,  baseline hemoglobin 10.1.  Metabolic panel with potassium 5.4.  The patient appears to be chronically hyperkalemic.  She was given 10 g of Lokelma for this.  Her BUN is elevated to 36, her creatinine is baseline 1.47 with a GFR of 34.  Her anion gap is 11.  Her initial troponin is 32 and her delta troponin is pending.  Magnesium is 2.3.  Her proBNP is 60.6.  Chest x-ray reveals no cardiopulmonary abnormality.  At this time, patient repeat troponin pending.  Signed out to attending Dr. Darra. Plan of management discussed.    Final diagnoses:  Atrial flutter, unspecified type [facility])  Tachycardia    ED Discharge Orders     None          Alexandra Dixon JULIANNA DEVONNA 01/12/24 1832    Darra Fonda MATSU, MD 01/12/24 2330

## 2024-01-12 NOTE — Telephone Encounter (Signed)
 Left voice message to call back 7/25

## 2024-01-12 NOTE — Pre-Procedure Assessment (Addendum)
 PASS-Perioperative Anesthesia & Surgical Screening Phone Screen interview ALERTS:    Travel screen performed during phone screen.   Eliquis  2.5mg  BID - ok to continue per Dr. Evertt  Procedure:   Procedure: Bleb revision left eye - Left  Procedure date: 01/15/2024 Procedure location: [facility]  CC/HPI  Alexandra Dixon is a 88 y.o. female with primary open angle glaucoma of left eye, moderate stage.  PMH of pAF, mild AS, hypothyroidism, hyperlipidemia, mild to moderate tricuspid insufficiency, first-degree AV block, CKD, glaucoma.   Vitals (36hrs): Height:  [149.9 cm (4' 11)] 149.9 cm (4' 11) Weight:  [82.6 kg (182 lb)] 82.6 kg (182 lb) BMI (Calculated):  [36.74] 36.74 Relevant Risk Scores: STOP BANG: 3 [facility] Activity Status Index (DASI): 18.95 Mini-COG: 0 Medical History                                    HEENT .  Glaucoma (s/p surgery - right) .  Cataract (s/p surgery - right) Removable dental implants  Pulmonary Denies respiratory symptoms.   Pt states able to lie flat for 30 min without CP or SOB  Cardiovascular  .  Exercise tolerance: >4 METS (lives independently, go grocery shopping, moderate housework) .  HTN:.  Antiplatelet therapy (Eliquis  bid)  .  Aortic Valve disease :      Stenosis, mild .  Atrial fibrillation:  paroxysmal Denies chest pain, dyspnea at rest or with exertion, palpitations, presyncope, syncope  Intermittent mild edema - managed with Lasix prn - last taken ~ 4 days ago  In August? in 2024 patient smart watch detected episodes of elevated HR in the 110-130 bpm range, with irregular rhythms suspicious for atrial fibrillation. This was confirmed by an ECG at her PCP's office, which clearly showed atrial fibrillation.  F/b cardiology LV on 07/07/23 stable -1 year f/u  - HTN - adequately controlled 133/66 (06/2023) - Mild AS on Echo 03/2023 - Afib- asymptomatic  - on Eliquis  2.5 mg BID  EKG: (03/20/2023) sinus bradycardia with mild first-degree  AV block and rightward axis deviation. per cardiology note on 07/07/23   ECHO 04/11/2023 1. LVEF 65 to 70%. LVEF by 3D volume is 64 %. There is severe asymmetric LVH of the basal-septal segment. 2. Right ventricular systolic function is normal. The right ventricular size is mildly enlarged. 3. Left atrial size was severely dilated.  4. The mitral valve is degenerative. Moderate mitral valve regurgitation.  5. The aortic valve is calcified. Aortic valve regurgitation is mild.  Mild aortic valve stenosis. Aortic regurgitation PHT measures 503 msec.  Aortic valve mean gradient measures 19.0 mmHg. Aortic valve Vmax measures 2.87 m/s.  6. The inferior vena cava is normal in size with greater than 50%  respiratory variability, suggesting right atrial pressure of 3 mmHg.   Comparison(s): Prior images unable to be directly viewed, comparison made  by report only. Aortic regurgitation is new from 2020 reported, slight  increase in aortic valve gradients, mitral valve regurgitation has  increased.   Gastrointestinal Hiatal hernia  GU/Renal .  Chronic kidney disease: Cr. 1.32, eGFR 40 on 09/07/23   Neuro/Spine/CNS .  Neuropathy (feet) Was seen at [facility] ED on 12/14/23 for a trip and fall going up stairs.   CT of head 12/14/23: IMPRESSION:  1. No evidence of acute intracranial abnormality.  2. No acute fracture or traumatic malalignment in the cervical  spine.  3. No acute facial fracture. Left supraorbital  contusion.   Hematology/Oncology  H/o Acquired thrombophilia   Rheumatology Normal as reviewed Endocrine .  Obesity: .  Thyroid  disorder: Hypothyroidism  A1c 5.8 on 09/07/23  Musculoskeletal .  Osteoarthritis knees  Pain Denies pain GYN .  Post-menopausal  No LMP recorded. Patient is postmenopausal.   Review of Systems   A comprehensive 10-system ROS was asked and was negative except for the following:  HEENT: contacts/glasses (glasses) GU/RENAL: bladder  incontinence   Past Surgical History   Past Surgical History:  Procedure Laterality Date  . APPENDECTOMY    . EXTRACTION CATARACT EXTRACAPSULAR W/INSERTION INTRAOCULAR PROSTHESIS Left 11/06/2023   Procedure: PHACO IOL/TRABECULECTOMY LEFT EYE;  Surgeon: Evertt Meribeth ORN, MD;  [facility];  Service: Ophthalmology;  Laterality: Left;  . GLAUCOMA EYE SURGERY Right 2022  . LENS EYE SURGERY    . REPLACEMENT TOTAL KNEE BILATERAL    . TONSILLECTOMY    . TRABECULECTOMY AB EXTERNO Left 11/06/2023   Procedure: PHACO IOL/TRABECULECTOMY LEFT EYE;  Surgeon: Evertt Meribeth ORN, MD;  [facility];  Service: Ophthalmology;  Laterality: Left;    Social/Family History   Social History   Occupational History  . Not on file  Tobacco Use  . Smoking status: Never  . Smokeless tobacco: Never  Vaping Use  . Vaping status: Never Used  Substance and Sexual Activity  . Alcohol use: Never  . Drug use: Never  . Sexual activity: Not on file   Vaping/E-Cigarettes  . Vaping/E-Cigarette Use Never User   . Start Date    . Cartridges/Day    . Quit Date     Family History  Problem Relation Age of Onset  . High blood pressure (Hypertension) Mother   . Diabetes Father   . High blood pressure (Hypertension) Sister   . Anesthesia problems Neg Hx    Allergies   Allergies  Allergen Reactions  . Niacin Other (See Comments)    Possible rash - been so long ago  . Simbrinza [Brinzolamide-Brimonidine] Other (See Comments)  . Timolol Other (See Comments)    Eye drops   Medications   Current Outpatient Medications  Medication  . latanoprost (XALATAN) 0.005 % ophthalmic solution  . prednisoLONE acetate (PRED FORTE) 1 % ophthalmic suspension  . trimethoprim-polymyxin b (POLYTRIM) ophthalmic solution  . dorzolamide (TRUSOPT) 2 % ophthalmic solution  . apixaban  (ELIQUIS ) 2.5 mg tablet  . doxazosin  (CARDURA ) 1 MG tablet  . FUROsemide (LASIX) 20 MG tablet  . levothyroxine (SYNTHROID) 50  MCG tablet  . lisinopriL-hydroCHLOROthiazide (ZESTORETIC) 20-25 mg tablet  . metoprolol  SUCCinate (TOPROL -XL) 25 MG XL tablet  . rosuvastatin (CRESTOR) 20 MG tablet  . GEMTESA 75 mg Tab   No current facility-administered medications for this visit.   Physical Exam  Phys Exam Test Results    No results found for: WBC, HGB, HCT, PLT, CHOL, TRIG, HDL, LDLDIRECT, ALT, AST, GLUCOSE, NA, K, CL, CALCIUM, MG, CREATININE, BUN, CO2, ALB, TSH, PSA, HGBA1C, INR, PT, APTT, VITD         Patient Instructions  Medication instructions prior to procedure:    Medication Sig INSTRUCTIONS  . apixaban  (ELIQUIS ) 2.5 mg tablet Take 2.5 mg by mouth every 12 (twelve) hours Continue per Dr. Lynita instructions  . dorzolamide (TRUSOPT) 2 % ophthalmic solution Place 1 drop into the right eye 2 (two) times daily As directed  . FUROsemide (LASIX) 20 MG tablet Take 1 tablet by mouth every other day DO NOT TAKE day of procedure  . GEMTESA 75  mg Tab Take 1 tablet by mouth once daily TAKE day of procedure  . latanoprost (XALATAN) 0.005 % ophthalmic solution Place 1 drop into the left eye at bedtime As directed  . levothyroxine (SYNTHROID) 50 MCG tablet Take 1 tablet by mouth once daily Mon, Wed, Sat TAKE day of procedure  . lisinopriL-hydroCHLOROthiazide (ZESTORETIC) 20-25 mg tablet TAKE 1 TABLET BY MOUTH DAILY *NEEDS OFFICE VISIT FOR REFILLS* DO NOT TAKE day of procedure  . metoprolol  SUCCinate (TOPROL -XL) 25 MG XL tablet Take 1 tablet by mouth at bedtime TAKE day of procedure  . rosuvastatin (CRESTOR) 20 MG tablet Take 1 tablet by mouth once daily TAKE day of procedure   Pre-operative Instructions for [facility]:  A nurse from the [facility] will call you the afternoon of the business day prior to surgery to give you the time of arrival on the day of surgery.  That nurse will also provide eating and drinking instructions for surgery.  If you do not  receive a telephone call by 5:00 pm the business day before your surgery, please call the [facility] at [phone] and ask to speak with a pre-op nurse.    If you received bathing instructions/sponges from your surgeon, please follow those instructions.  If you did not receive bathing instructions/sponges from your surgeon, please take a shower with an antibacterial soap (ie. Dial) the night before and the morning of surgery.  After that morning shower, do not apply deodorant, make up, lotion, perfumes or powder to your skin. Do not wear contact lenses.  All jewelry, including body jewelry, and nail polish should to be removed prior to surgery.  On the day of surgery, please bring a government-issued photo ID, insurance card and any payment you may owe.  A responsible adult (69 years of age or older) must accompany each patient day of surgery and stay the entire time.    [facility] is located at [address] [place], [place]  [address]. The parking garage is on the right when you arrive at the above address.  Upon entering the building, please report to the 3rd floor and check in at the desk.   Problem List   Patient Active Problem List  Diagnosis  . Pseudophakia of right eye  . Chronic angle-closure glaucoma of right eye, mild stage  . Age-related hypermature cataract of left eye  . Chronic angle-closure glaucoma of left eye, moderate stage  . PAF (paroxysmal atrial fibrillation) (CMS/HHS-HCC)  . Hypertension, essential  . Stage 3b chronic kidney disease (CMS/HHS-HCC)  . Anticoagulated  . Preoperative evaluation of a medical condition to rule out surgical contraindications (TAR required)  . Pseudophakia of both eyes   Assessment & Plan  Plan  Day of Surgery  Day of Surgery Exam

## 2024-01-12 NOTE — Telephone Encounter (Signed)
 Pt c/o of Chest Pain: STAT if active (IN THIS MOMENT) CP, including tightness, pressure, jaw pain, shoulder/upper arm/back pain, SOB, nausea, and vomiting.  1. Are you having CP right now (tightness, pressure, or discomfort)?  No pain--just tightness. Started around 3:45 PM. No symptoms currently. Patient is currently admitted. Declined speaking with triage nurse right now.  2. Are you experiencing any other symptoms (ex. SOB, nausea, vomiting, sweating)?  Elevated HR--121 currently Low BP--98 systolic  3. How long have you been experiencing CP?  Tightness started around 3:45 PM  4. Is your CP continuous or coming and going?  Coming and going   5. Have you taken Nitroglycerin?

## 2024-01-12 NOTE — ED Triage Notes (Signed)
 Was sitting eating lunch her watch alarmed with HR 130s  Recently stopped a med by cards  Feeling anxious

## 2024-01-12 NOTE — Discharge Instructions (Signed)
 You went into atrial flutter today.  You came out of it with medication and we were able to follow your labs which were reassuring.  Please follow closely with your primary care doctor.  Return with any new or suddenly worsening symptoms.

## 2024-01-13 DIAGNOSIS — I495 Sick sinus syndrome: Secondary | ICD-10-CM | POA: Insufficient documentation

## 2024-01-13 DIAGNOSIS — I350 Nonrheumatic aortic (valve) stenosis: Secondary | ICD-10-CM | POA: Insufficient documentation

## 2024-01-13 DIAGNOSIS — D649 Anemia, unspecified: Secondary | ICD-10-CM | POA: Insufficient documentation

## 2024-01-13 DIAGNOSIS — N1832 Chronic kidney disease, stage 3b: Secondary | ICD-10-CM | POA: Insufficient documentation

## 2024-01-13 DIAGNOSIS — E039 Hypothyroidism, unspecified: Secondary | ICD-10-CM | POA: Insufficient documentation

## 2024-01-13 DIAGNOSIS — I480 Paroxysmal atrial fibrillation: Secondary | ICD-10-CM | POA: Insufficient documentation

## 2024-01-16 ENCOUNTER — Encounter: Payer: Self-pay | Admitting: Cardiovascular Disease

## 2024-01-16 DIAGNOSIS — I480 Paroxysmal atrial fibrillation: Secondary | ICD-10-CM

## 2024-01-16 MED ORDER — DILTIAZEM HCL 30 MG PO TABS: 30.0000 mg | ORAL_TABLET | ORAL | 0 refills | Status: AC | PRN

## 2024-01-16 NOTE — Telephone Encounter (Signed)
 Please send a prescription in for diltiazem  30 mg tablets,, #15, no refills.  She can take 1 as needed for sustained rapid palpitations over 120 bpm and can repeat the dose in 60 minutes if heart rate is still fast. If she agrees, please also make referral to EP to discuss ablation for atrial flutter and atrial fibrillation.

## 2024-01-22 NOTE — Telephone Encounter (Signed)
 See 01/16/24 my chart message.

## 2024-01-25 ENCOUNTER — Telehealth: Payer: Self-pay | Admitting: Cardiovascular Disease

## 2024-01-25 NOTE — Telephone Encounter (Signed)
 Called back with correct fax number #[phone]. Please advise

## 2024-01-25 NOTE — Telephone Encounter (Signed)
   Pre-operative Risk Assessment    Patient Name: Alexandra Dixon  DOB: [birth year] MRN: [id number]   Date of last office visit: 01/05/24 Date of next office visit: 02/07/24   Request for Surgical Clearance    Procedure:  Trabeculectomy revision  Date of Surgery:  Clearance 01/29/24                                Surgeon:  Dr. Evertt Socks Group or Practice Name:  [facility]  Phone number:  [phone]  Fax number:  [phone]   Type of Clearance Requested:   - Medical  - Pharmacy:  Hold        Type of Anesthesia:  Local    Additional requests/questions:  Caller (Dr. Zeke) noted patient recently had an atrial flutter episode.   Signed, Jasmin B Wilson   01/25/2024, 3:17 PM

## 2024-01-29 NOTE — Telephone Encounter (Signed)
 Patient with diagnosis of afib on Eliquis  for anticoagulation.    Procedure: Trabeculectomy revision  Date of procedure: TBD per patient   CHA2DS2-VASc Score = 4   This indicates a 4.8% annual risk of stroke. The patient's score is based upon: CHF History: 0 HTN History: 1 Diabetes History: 0 Stroke History: 0 Vascular Disease History: 0 Age Score: 2 Gender Score: 1    The patient is scheduled to see the EP specialist on August 20. She confirmed that she is still waiting to hear from [facility] regarding the procedure date. Clearance will be provided after her evaluation with the EP specialist on August 20.  **This guidance is not considered finalized until pre-operative APP has relayed final recommendations.**

## 2024-02-07 ENCOUNTER — Ambulatory Visit: Attending: Cardiology | Admitting: Cardiovascular Disease

## 2024-02-07 ENCOUNTER — Encounter: Payer: Self-pay | Admitting: Cardiovascular Disease

## 2024-02-07 VITALS — BP 150/70 | HR 57 | Ht 59.0 in | Wt 179.0 lb

## 2024-02-07 DIAGNOSIS — I495 Sick sinus syndrome: Secondary | ICD-10-CM | POA: Diagnosis not present

## 2024-02-07 DIAGNOSIS — I480 Paroxysmal atrial fibrillation: Secondary | ICD-10-CM

## 2024-02-07 NOTE — Patient Instructions (Signed)
 Medication Instructions:  Your physician recommends that you continue on your current medications as directed. Please refer to the Current Medication list given to you today.  *If you need a refill on your cardiac medications before your next appointment, please call your pharmacy*  Lab Work: None ordered.  If you have labs (blood work) drawn today and your tests are completely normal, you will receive your results only by: MyChart Message (if you have MyChart) OR A paper copy in the mail If you have any lab test that is abnormal or we need to change your treatment, we will call you to review the results.  Testing/Procedures: None ordered.   Follow-Up: At [facility], you and your health needs are our priority.  As part of our continuing mission to provide you with exceptional heart care, our providers are all part of one team.  This team includes your primary Cardiologist (physician) and Advanced Practice Providers or APPs (Physician Assistants and Nurse Practitioners) who all work together to provide you with the care you need, when you need it.  Your next appointment:   To be determined

## 2024-02-07 NOTE — Progress Notes (Signed)
 Electrophysiology Office Note:    Date:  02/07/2024   ID:  Laruth, Hanger May 07, 1936, MRN [id number]  PCP:  Benjamine Aland, MD   [facility] Providers Cardiologist:  Jerel Balding, MD     Referring MD: Balding Jerel, MD   History of Present Illness:    NERIA PROCTER is a 88 y.o. female with a medical history significant for atrial flutter, atrial fibrillation, morbid obesity hypertension, mild aortic stenosis, first-degree AV block, CKD 3, referred for arrhythmia management.     Discussed the use of AI scribe software for clinical note transcription with the patient, who gave verbal consent to proceed.  History of Present Illness SHRADDHA LEBRON is an 88 year old female with atrial fibrillation and atrial flutter who presents for evaluation of her arrhythmias.  Atrial fibrillation was initially detected by her smart watch and confirmed by EKG. A monitor in October 2024 showed a low burden of atrial fibrillation, which she was unaware of at the time. Recently, she experienced an episode of atrial flutter requiring emergency room treatment without overnight admission. Rates are uncontrolled in AF.  A heart monitor in October 2024 indicated sinus rhythm predominance with a heart rate ranging from 38 to 81 beats per minute, averaging 52 beats per minute. There was 1% atrial fibrillation with an average rate of 128 beats per minute and end rates up to 166 beats per minute.    She is currently on Eliquis .         Today, she reports that she feels reasonly well --no palpitations, shortness of breath, fatigue  EKGs/Labs/Other Studies Reviewed Today:     Echocardiogram:  TTE April 11, 2023 LVEF 65 to 70%.  There is LVH of the septal basal segment.  Left atrium is severely dilated.  Mitral valve is degenerative.  Mild aortic valve stenosis.   Monitors:  12 day monitor October 2024-- my interpretation Sinus rhythm predominates, heart rate 38 to 81 bpm.   Average 52 bpm 1% burden of atrial fibrillation with an average rate of 128 bpm and rates up to 166 bpm.  EKG:   EKG Interpretation Date/Time:  Wednesday February 07 2024 15:07:30 EDT Ventricular Rate:  57 PR Interval:  228 QRS Duration:  76 QT Interval:  408 QTC Calculation: 397 R Axis:   60  Text Interpretation: Sinus bradycardia with sinus arrhythmia with 1st degree A-V block When compared with ECG of 12-Jan-2024 16:29, Sinus rhythm has replaced atrial flutter Confirmed by Nancey Scotts [phone]) on 02/07/2024 3:21:04 PM     Physical Exam:    VS:  BP (!) 150/70 (BP Location: Right Arm, Patient Position: Sitting, Cuff Size: Large)   Pulse (!) 57   Ht 4' 11 (1.499 m)   Wt 179 lb (81.2 kg)   SpO2 98%   BMI 36.15 kg/m     Wt Readings from Last 3 Encounters:  02/07/24 179 lb (81.2 kg)  01/05/24 185 lb (83.9 kg)  07/07/23 183 lb 12.8 oz (83.4 kg)     GEN: Well nourished, well developed in no acute distress CARDIAC: RRR, no murmurs, rubs, gallops RESPIRATORY:  Normal work of breathing MUSCULOSKELETAL: no edema    ASSESSMENT & PLAN:     Paroxysmal atrial fibrillation and atrial flutter Low burden Atrial fibrillation is reasonably well tolerated, but she does not well-tolerate the faster ventricular rates of flutter Flutter will be difficult to control medically, and I suspect, would require a rhythm control approach. This would likely entail amiodarone  and possible a pacemaker as her renal function is tenuous for tikosyn. Left atrium is severely enlarged  Sinus bradycardia Limits medical therapy Tachy-brady syndrome  Secondary hypercoagulable state Continue Eliquis  2.5 mg p.o. twice daily  Mild to moderate aortic stenosis  Obesity Weight loss advised  [place] Sees Dr. Evertt at [facility] and is hoping to undergo a corrective surgery  I personally communicated with Dr. Francyne to develop a plan -- ablation vs pacemaker and medical therapy -- moving  forward.  Signed, Eulas FORBES Furbish, MD  02/07/2024 5:18 PM    [facility]

## 2024-02-07 NOTE — Telephone Encounter (Signed)
   Name: Alexandra Dixon  DOB: May 04, 1936  MRN: [id number]  Primary Cardiologist: Jerel Balding, MD  Chart reviewed as part of pre-operative protocol coverage. The patient has an upcoming visit scheduled with Dr. Nancey on 02/07/2024 at which time clearance can be addressed in case there are any issues that would impact surgical recommendations.  Trabeculectomy revision Is not scheduled until TBD as below. I added preop FYI to appointment note so that provider is aware to address at time of outpatient visit.  Per office protocol the cardiology provider should forward their finalized clearance decision and recommendations regarding antiplatelet therapy to the requesting party below.    CHA2DS2-VASc Score = 4   This indicates a 4.8% annual risk of stroke. The patient's score is based upon: CHF History: 0 HTN History: 1 Diabetes History: 0 Stroke History: 0 Vascular Disease History: 0 Age Score: 2 Gender Score: 1     The patient is scheduled to see the EP specialist on August 20. She confirmed that she is still waiting to hear from [facility] regarding the procedure date. Clearance will be provided after her evaluation with the EP specialist on August 20.  I will route this message as FYI to requesting party and remove this message from the preop box as separate preop APP input not needed at this time.   Please call with any questions.  Lamarr Satterfield, NP  02/07/2024, 11:17 AM

## 2024-02-08 ENCOUNTER — Other Ambulatory Visit: Payer: Self-pay | Admitting: Cardiology

## 2024-02-08 NOTE — Telephone Encounter (Signed)
 I will forward to preop APP to review if the pt has been cleared.

## 2024-02-08 NOTE — Progress Notes (Signed)
 My vote would be to do an ablation, at least for her flutter.  Whether or not it is worth tagging on a transseptal and pulmonary vein isolation is definitely your call.

## 2024-02-08 NOTE — Telephone Encounter (Signed)
Patient called to follow-up on the status of her clearance.

## 2024-02-08 NOTE — Telephone Encounter (Signed)
 Patient with diagnosis of afib on Eliquis  for anticoagulation.    Procedure: Trabeculectomy revision  Date of procedure: ASAP   CHA2DS2-VASc Score = 4   This indicates a 4.8% annual risk of stroke. The patient's score is based upon: CHF History: 0 HTN History: 1 Diabetes History: 0 Stroke History: 0 Vascular Disease History: 0 Age Score: 2 Gender Score: 1      CrCl 25 ml/min Platelet count 238  Patient has not had an Afib/aflutter ablation within the last 3 months or DCCV within the last 30 days  Patient converted from aflutter w/ diltiazem  4 weeks ago tomorrow.  Per office protocol, patient can hold Eliquis  for 2-3 days prior to procedure.    **This guidance is not considered finalized until pre-operative APP has relayed final recommendations.**

## 2024-02-08 NOTE — Telephone Encounter (Signed)
   Primary Cardiologist: Jerel Balding, MD  Chart reviewed as part of pre-operative protocol coverage. Given past medical history and time since last visit, based on ACC/AHA guidelines, Alexandra Dixon would be at acceptable risk for the planned procedure without further cardiovascular testing.   Patient should contact our office if she is having new symptoms that are concerning from a cardiac perspective to arrange a follow-up appointment.    Per office protocol, patient can hold Eliquis  for 2-3 days prior to procedure.    I will route this recommendation to the requesting party via Epic fax function and remove from pre-op pool.  Please call with questions.  [name], NP-C 02/08/2024, 1:21 PM [address] [place], [place] [address] Office [phone] Fax [phone]

## 2024-02-14 NOTE — Progress Notes (Unsigned)
 Electrophysiology Office Note:    Date:  02/15/2024   ID:  Alexandra Dixon, DOB [birth year], MRN [id number]  PCP:  Benjamine Aland, MD   [facility] Providers Cardiologist:  Jerel Balding, MD     Referring MD: Benjamine Aland, MD   History of Present Illness:    Alexandra Dixon is a 88 y.o. female with a medical history significant for atrial flutter, atrial fibrillation, morbid obesity hypertension, mild aortic stenosis, first-degree AV block, CKD 3, referred for arrhythmia management.     Discussed the use of AI scribe software for clinical note transcription with the patient, who gave verbal consent to proceed.  History of Present Illness Alexandra Dixon is an 88 year old female with atrial fibrillation and atrial flutter who presents for evaluation of her arrhythmias.  Atrial fibrillation was initially detected by her smart watch and confirmed by EKG. A monitor in October 2024 showed a low burden of atrial fibrillation, which she was unaware of at the time. Recently, she experienced an episode of atrial flutter requiring emergency room treatment without overnight admission. Rates are uncontrolled in AF.  A heart monitor in October 2024 indicated sinus rhythm predominance with a heart rate ranging from 38 to 81 beats per minute, averaging 52 beats per minute. There was 1% atrial fibrillation with an average rate of 128 beats per minute and end rates up to 166 beats per minute.    She is currently on Eliquis .         Today, she reports that she feels reasonly well --no palpitations, shortness of breath, fatigue.  She returns today to discuss management strategy for atrial fibrillation and flutter.  EKGs/Labs/Other Studies Reviewed Today:     Echocardiogram:  TTE April 11, 2023 LVEF 65 to 70%.  There is LVH of the septal basal segment.  Left atrium is severely dilated.  Mitral valve is degenerative.  Mild aortic valve stenosis.   Monitors:  12 day monitor October  2024-- my interpretation Sinus rhythm predominates, heart rate 38 to 81 bpm.  Average 52 bpm 1% burden of atrial fibrillation with an average rate of 128 bpm and rates up to 166 bpm.  EKG:   EKG Interpretation Date/Time:  Thursday February 15 2024 11:53:57 EDT Ventricular Rate:  54 PR Interval:  230 QRS Duration:  70 QT Interval:  412 QTC Calculation: 390 R Axis:   75  Text Interpretation: Sinus bradycardia with 1st degree A-V block When compared with ECG of 07-Feb-2024 15:07, No significant change was found Confirmed by Nancey Scotts [phone]) on 02/15/2024 12:02:47 PM     Physical Exam:    VS:  BP 138/60 (BP Location: Right Arm, Patient Position: Sitting, Cuff Size: Large)   Pulse (!) 54   Ht 4' 11 (1.499 m)   Wt 177 lb (80.3 kg)   SpO2 99%   BMI 35.75 kg/m     Wt Readings from Last 3 Encounters:  02/15/24 177 lb (80.3 kg)  02/07/24 179 lb (81.2 kg)  01/05/24 185 lb (83.9 kg)     GEN: Well nourished, well developed in no acute distress CARDIAC: RRR, no murmurs, rubs, gallops RESPIRATORY:  Normal work of breathing MUSCULOSKELETAL: no edema    ASSESSMENT & PLAN:     Paroxysmal atrial fibrillation and atrial flutter Low burden Atrial fibrillation is reasonably well tolerated, but she does not well-tolerate the faster ventricular rates of flutter Flutter will be difficult to control medically, and I suspect, would require a rhythm control  approach. This would likely entail either ablation or amiodarone and possible a pacemaker as her renal function is tenuous for tikosyn. Left atrium is severely enlarged Acute medicated with Dr. Francyne, and we concluded that the optimal approach would be to perform the atrial flutter ablation and also perform atrial fibrillation ablation at the same time. I had another discussion today with the patient and her daughter.  Using a shared decision making approach, we opted to schedule the ablation for atrial fibrillation and  flutter.  We discussed the indication, rationale, logistics, anticipated benefits, and potential risks of the ablation procedure including but not limited to -- bleed at the groin access site, chest pain, damage to nearby organs such as the diaphragm, lungs, or esophagus, need for a drainage tube, or prolonged hospitalization. I explained that the risk for stroke, heart attack, need for open chest surgery, or even death is very low but not zero. she  expressed understanding and wishes to proceed.   Sinus bradycardia Limits medical therapy Tachy-brady syndrome  Secondary hypercoagulable state Continue Eliquis  2.5 mg p.o. twice daily  Mild to moderate aortic stenosis  Obesity Weight loss advised  [place] Cardiac stratification has already been performed by our office (see encounters tab).  From an EP perspective, she will need to remain uninterrupted anticoagulation for the month prior to ablation in 3 months after ablation.  She does not need to delay her surgery until after the ablation.  Signed, Eulas FORBES Furbish, MD  02/15/2024 9:03 PM    [facility]

## 2024-02-15 ENCOUNTER — Ambulatory Visit: Attending: Cardiovascular Disease | Admitting: Cardiovascular Disease

## 2024-02-15 ENCOUNTER — Encounter: Payer: Self-pay | Admitting: Cardiovascular Disease

## 2024-02-15 VITALS — BP 138/60 | HR 54 | Ht 59.0 in | Wt 177.0 lb

## 2024-02-15 DIAGNOSIS — Z01812 Encounter for preprocedural laboratory examination: Secondary | ICD-10-CM

## 2024-02-15 DIAGNOSIS — E782 Mixed hyperlipidemia: Secondary | ICD-10-CM

## 2024-02-15 DIAGNOSIS — I10 Essential (primary) hypertension: Secondary | ICD-10-CM

## 2024-02-15 DIAGNOSIS — I350 Nonrheumatic aortic (valve) stenosis: Secondary | ICD-10-CM

## 2024-02-15 DIAGNOSIS — I480 Paroxysmal atrial fibrillation: Secondary | ICD-10-CM

## 2024-02-15 DIAGNOSIS — I495 Sick sinus syndrome: Secondary | ICD-10-CM

## 2024-02-15 NOTE — Patient Instructions (Signed)
 Medication Instructions:  Your physician recommends that you continue on your current medications as directed. Please refer to the Current Medication list given to you today.  *If you need a refill on your cardiac medications before your next appointment, please call your pharmacy*  Lab Work: CBC and BMET - please have pre-procedure lab work completed on Tuesday 05/07/2024 . This can be done at ANY [facility] near you - no appointment required and this does not have to be fasting. If you have labs (blood work) drawn today and your tests are completely normal, you will receive your results only by: MyChart Message (if you have MyChart) OR A paper copy in the mail If you have any lab test that is abnormal or we need to change your treatment, we will call you to review the results.  Testing/Procedures: Cardiac CT - someone will contact you to schedule this  Your physician has requested that you have cardiac CT. Cardiac computed tomography (CT) is a painless test that uses an x-ray machine to take clear, detailed pictures of your heart. For further information please visit [email or web address] Please follow instruction sheet as given.   Atrial Fibrillation Ablation - scheduled on Tuesday 05/21/2024 We will be in contact closer to your ablation date with further instructions Your physician has recommended that you have an ablation. Catheter ablation is a medical procedure used to treat some cardiac arrhythmias (irregular heartbeats). During catheter ablation, a long, thin, flexible tube is put into a blood vessel in your groin (upper thigh), or neck. This tube is called an ablation catheter. It is then guided to your heart through the blood vessel. Radio frequency waves destroy small areas of heart tissue where abnormal heartbeats may cause an arrhythmia to start. Please see the instruction sheet given to you today.  Follow-Up: At [facility], you and your health needs are our priority.  As part  of our continuing mission to provide you with exceptional heart care, our providers are all part of one team.  This team includes your primary Cardiologist (physician) and Advanced Practice Providers or APPs (Physician Assistants and Nurse Practitioners) who all work together to provide you with the care you need, when you need it.  Your next appointment:   We will schedule follow up after your ablation  Provider:   Eulas Furbish, MD   Cardiac Ablation Cardiac ablation is a procedure to destroy, or ablate, a small amount of heart tissue that is causing problems. The heart has many electrical connections. Sometimes, these connections are abnormal and can cause the heart to beat very fast or irregularly. Ablating the abnormal areas can improve the heart's rhythm or return it to normal. Ablation may be done for people who: Have irregular or rapid heartbeats (arrhythmias). Have Wolff-Parkinson-White syndrome. Have taken medicines for an arrhythmia that did not work or caused side effects. Have a high-risk heartbeat that may be life-threatening. Tell a health care provider about: Any allergies you have. All medicines you are taking, including vitamins, herbs, eye drops, creams, and over-the-counter medicines. Any problems you or family members have had with anesthesia. Any bleeding problems you have. Any surgeries you have had. Any medical conditions you have. Whether you are pregnant or may be pregnant. What are the risks? Your health care provider will talk with you about risks. These may include: Infection. Bruising and bleeding. Stroke or blood clots. Damage to nearby structures or organs. Allergic reaction to medicines or dyes. Needing a pacemaker if the heart gets damaged. A  pacemaker is a device that helps the heart beat normally. Failure of the procedure. A repeat procedure may be needed. What happens before the procedure? Medicines Ask your health care provider  about: Changing or stopping your regular medicines. These include any heart rhythm medicines, diabetes medicines, or blood thinners you take. Taking medicines such as aspirin  and ibuprofen. These medicines can thin your blood. Do not take them unless your health care provider tells you to. Taking over-the-counter medicines, vitamins, herbs, and supplements. General instructions Follow instructions from your health care provider about what you may eat and drink. If you will be going home right after the procedure, plan to have a responsible adult: Take you home from the hospital or clinic. You will not be allowed to drive. Care for you for the time you are told. Ask your health care provider what steps will be taken to prevent infection. What happens during the procedure?  An IV will be inserted into one of your veins. You may be given: A sedative. This helps you relax. Anesthesia. This will: Numb certain areas of your body. An incision will be made in your neck or your groin. A needle will be inserted through the incision and into a large vein in your neck or groin. The small, thin tube (catheter) will be inserted through the needle and moved to your heart. A type of X-ray (fluoroscopy) will be used to help guide the catheter and provide images of the heart on a monitor. Dye may be injected through the catheter to help your surgeon see the area of the heart that needs treatment. Electrical currents will be sent from the catheter to destroy heart tissue in certain areas. There are three types of energy that may be used to do this: Heat (radiofrequency energy). Laser energy. Extreme cold (cryoablation). When the tissue has been destroyed, the catheter will be removed. Pressure will be held on the insertion area to prevent bleeding. A bandage (dressing) will be placed over the insertion area. The procedure may vary among health care providers and hospitals. What happens after the  procedure? Your blood pressure, heart rate and rhythm, breathing rate, and blood oxygen level will be monitored until you leave the hospital or clinic. Your insertion area will be checked for bleeding. You will need to lie still for a few hours. If your groin was used, you will need to keep your leg straight for a few hours after the catheter is removed. This information is not intended to replace advice given to you by your health care provider. Make sure you discuss any questions you have with your health care provider. Document Revised: 11/23/2021 Document Reviewed: 11/23/2021 Elsevier Patient Education  2024 [employer].

## 2024-02-27 ENCOUNTER — Other Ambulatory Visit: Payer: Self-pay | Admitting: Cardiovascular Disease

## 2024-02-27 DIAGNOSIS — I480 Paroxysmal atrial fibrillation: Secondary | ICD-10-CM

## 2024-02-27 NOTE — Telephone Encounter (Signed)
 Eliquis  2.5mg  refill request received. Patient is 88 years old, weight-80.3kg, Crea-1.47 on 01/12/24, Diagnosis-Afib/Aflutter, and last seen by Dr Nancey on 02/15/24. Will send in refill to requested pharmacy.    Per last OV note on 02/15/24 it states- Secondary hypercoagulable state Continue Eliquis  2.5 mg p.o. twice daily  Per PharmD patient to continue current dose as she is pending afib ablation and per last OV note MD recommends dose.

## 2024-02-28 ENCOUNTER — Telehealth: Payer: Self-pay | Admitting: Cardiovascular Disease

## 2024-02-28 NOTE — Telephone Encounter (Signed)
 Returned call to Pt.  Pt was alerted to high heart rate by her watch.  She is asymptomatic but concerned her heart is going too fast.  Pt states she took one dose of PRN diltiazem  at 8:00 am.  At 9:00 am her heart rate was still 130 bpm so she took an additional dose.  Pt states she took her scheduled Toprol  5 minutes ago.  Discussed with Dr. JAYSON.  Pt can take additional diltiazem  as needed as long as blood pressure allows.  Discussed further tx with Pt.  Advised Pt relax for the next few hours and let the Toprol  get in her system.  Will call Pt back at 12:30 pm to reassess pulse/blood pressure and determine if any further action needed.  Pt agreeable to plan.  Will continue to monitor.

## 2024-02-28 NOTE — Telephone Encounter (Signed)
 Returned call to Pt.  Per Pt her heart rate has returned to normal.  She asks if it is ok to go ahead with scheduled cataract surgery.  Advised it should be fine.    She will call if any further issues.

## 2024-02-28 NOTE — Telephone Encounter (Signed)
 STAT if HR is under 50 or over 120  (normal HR is 60-100 beats per minute)  What is your heart rate? 130-137  Do you have a log of your heart rate readings (document readings)? Fluxuating between 130 and 137 since 7 am. Has taken   diltiazem  (CARDIZEM ) 30 MG tablet   At 8 am but HR has not gone down  Do you have any other symptoms? No   Unable to get through to triage

## 2024-02-28 NOTE — Telephone Encounter (Signed)
 Attempted to call triage back a second time

## 2024-03-11 DIAGNOSIS — E66812 Obesity, class 2: Secondary | ICD-10-CM | POA: Diagnosis not present

## 2024-03-11 DIAGNOSIS — E785 Hyperlipidemia, unspecified: Secondary | ICD-10-CM | POA: Diagnosis not present

## 2024-03-11 DIAGNOSIS — I129 Hypertensive chronic kidney disease with stage 1 through stage 4 chronic kidney disease, or unspecified chronic kidney disease: Secondary | ICD-10-CM | POA: Diagnosis not present

## 2024-03-11 DIAGNOSIS — H401122 Primary open-angle glaucoma, left eye, moderate stage: Secondary | ICD-10-CM | POA: Diagnosis not present

## 2024-03-11 DIAGNOSIS — I480 Paroxysmal atrial fibrillation: Secondary | ICD-10-CM | POA: Diagnosis not present

## 2024-03-11 DIAGNOSIS — Z9889 Other specified postprocedural states: Secondary | ICD-10-CM | POA: Diagnosis not present

## 2024-03-11 DIAGNOSIS — N1832 Chronic kidney disease, stage 3b: Secondary | ICD-10-CM | POA: Diagnosis not present

## 2024-03-11 DIAGNOSIS — Z9883 Filtering (vitreous) bleb after glaucoma surgery status: Secondary | ICD-10-CM | POA: Diagnosis not present

## 2024-03-11 DIAGNOSIS — E039 Hypothyroidism, unspecified: Secondary | ICD-10-CM | POA: Diagnosis not present

## 2024-03-11 DIAGNOSIS — H401123 Primary open-angle glaucoma, left eye, severe stage: Secondary | ICD-10-CM | POA: Diagnosis not present

## 2024-03-11 DIAGNOSIS — Z7901 Long term (current) use of anticoagulants: Secondary | ICD-10-CM | POA: Diagnosis not present

## 2024-03-19 DIAGNOSIS — Z2989 Encounter for other specified prophylactic measures: Secondary | ICD-10-CM | POA: Diagnosis not present

## 2024-03-19 DIAGNOSIS — Z23 Encounter for immunization: Secondary | ICD-10-CM | POA: Diagnosis not present

## 2024-04-23 DIAGNOSIS — H402222 Chronic angle-closure glaucoma, left eye, moderate stage: Secondary | ICD-10-CM | POA: Diagnosis not present

## 2024-04-25 ENCOUNTER — Telehealth: Payer: Self-pay

## 2024-04-25 NOTE — Telephone Encounter (Signed)
-----   Message from Nurse Aldona R sent at 02/15/2024  5:27 PM EDT ----- Regarding: 05/21/2024 AFib/Flutter with Mealor Important: list procedure date as first item in subject line, followed by procedure type (e.g., 03/01/24 AFib ablation)  Precert:  MD: Mealor Type of ablation: A-fib/Flutter Diagnosis: Afib CPT code: A-fib (93656)Flutter [id number] Ablation scheduled (date/time): 05/21/24 10am  Procedure:  Added to calendar? Yes Orders entered? No, >30 days before procedure Letter complete? No, >30 days before procedure Scheduled with cath lab? Yes Any medications to hold? Yes (please list hold instructions): Eliquis   Labs ordered (CBC, BMET, PT/INR if on warfarin): Yes Mapping system: CARTO (lab 4 or 6) CARTO/OPAL rep notified? Yes Cardiac CT needed: Yes, ordered Letters sent: MyChart H&P: 8/28 Device: No  Follow-up:  Cassie/Angel, please schedule Routine.  Covering RN - please send this message to [employer], EP scheduler, EP Scheduling pool, EP [employer], and CT scheduler (Brittany Lynch/Stephanie Mogg), if indicated.

## 2024-04-25 NOTE — Telephone Encounter (Signed)
 Called pt to inform her that she would need to have labs done prior to her CT scan on 11/11 - she will go to [facility] near her tomorrow 11/7 to have this done.   She is aware of her CT on 11/11..  I will sent Instruction letters via MyChart per pt's request - she will review and let us  know if she has any questions or concerns.

## 2024-04-26 ENCOUNTER — Telehealth (HOSPITAL_COMMUNITY): Payer: Self-pay

## 2024-04-26 ENCOUNTER — Encounter (HOSPITAL_COMMUNITY): Payer: Self-pay

## 2024-04-26 DIAGNOSIS — I480 Paroxysmal atrial fibrillation: Secondary | ICD-10-CM | POA: Diagnosis not present

## 2024-04-26 NOTE — Telephone Encounter (Signed)
 Attempted to reach patient to discuss upcoming procedure, no answer. Left VM for patient to return call at cell number. No VM available at home number.

## 2024-04-27 LAB — CBC
Hematocrit: 35.5 % (ref 34.0–46.6)
Hemoglobin: 11.1 g/dL (ref 11.1–15.9)
MCH: 28 pg (ref 26.6–33.0)
MCHC: 31.3 g/dL — ABNORMAL LOW (ref 31.5–35.7)
MCV: 90 fL (ref 79–97)
Platelets: 279 x10E3/uL (ref 150–450)
RBC: 3.96 x10E6/uL (ref 3.77–5.28)
RDW: 13.3 % (ref 11.7–15.4)
WBC: 4.2 x10E3/uL (ref 3.4–10.8)

## 2024-04-27 LAB — BASIC METABOLIC PANEL WITH GFR
BUN/Creatinine Ratio: 36 — ABNORMAL HIGH (ref 12–28)
BUN: 53 mg/dL — ABNORMAL HIGH (ref 8–27)
CO2: 21 mmol/L (ref 20–29)
Calcium: 9.6 mg/dL (ref 8.7–10.3)
Chloride: 106 mmol/L (ref 96–106)
Creatinine, Ser: 1.49 mg/dL — ABNORMAL HIGH (ref 0.57–1.00)
Glucose: 104 mg/dL — ABNORMAL HIGH (ref 70–99)
Potassium: 5.4 mmol/L — ABNORMAL HIGH (ref 3.5–5.2)
Sodium: 139 mmol/L (ref 134–144)
eGFR: 34 mL/min/1.73 — ABNORMAL LOW (ref >59)

## 2024-04-30 ENCOUNTER — Ambulatory Visit (HOSPITAL_COMMUNITY)
Admission: RE | Admit: 2024-04-30 | Discharge: 2024-04-30 | Disposition: A | Discharge status: Home / Self Care | Source: Ambulatory Visit | Attending: Cardiovascular Disease | Admitting: Cardiovascular Disease

## 2024-04-30 DIAGNOSIS — I480 Paroxysmal atrial fibrillation: Secondary | ICD-10-CM | POA: Insufficient documentation

## 2024-04-30 MED ORDER — IOHEXOL 350 MG/ML SOLN
100.0000 mL | Freq: Once | INTRAVENOUS | Status: AC | PRN
  Administered 2024-04-30: 100 mL via INTRAVENOUS

## 2024-05-01 ENCOUNTER — Ambulatory Visit: Payer: Self-pay | Admitting: Cardiovascular Disease

## 2024-05-14 ENCOUNTER — Other Ambulatory Visit (HOSPITAL_COMMUNITY): Payer: Self-pay

## 2024-05-14 DIAGNOSIS — Z01812 Encounter for preprocedural laboratory examination: Secondary | ICD-10-CM

## 2024-05-14 DIAGNOSIS — E875 Hyperkalemia: Secondary | ICD-10-CM

## 2024-05-14 DIAGNOSIS — I480 Paroxysmal atrial fibrillation: Secondary | ICD-10-CM

## 2024-05-14 NOTE — Telephone Encounter (Signed)
 Spoke with patient to discuss upcoming procedure.   CT: completed.  Labs: completed. She will repeat BMP this week to evaluate K+ level.  Any recent signs of acute illness or been started on antibiotics? No Any new medications started?No Any medications to hold?  No Any missed doses of blood thinner?  No Advised patient to continue taking Eliquis  (Apixaban ) twice daily without missing any doses.  Medication instructions:  On the morning of your procedure DO NOT take any medication., including Eliquis  (Apixaban ) or the procedure may be rescheduled. Nothing to eat or drink after midnight prior to your procedure.  Confirmed patient is scheduled for Atrial Fibrillation Ablation, Atrial Flutter Ablation on Tuesday, December 2 with Dr. Nancey. Instructed patient to arrive at the Main Entrance A at [facility]: [address] [place], [place] [address] and check in at Admitting at 7:30 AM.   Plan to go home the same day, you will only stay overnight if medically necessary. You MUST have a responsible adult to drive you home and MUST be with you the first 24 hours after you arrive home or your procedure could be cancelled.  Informed patient a nurse will call a day before the procedure to confirm arrival time and ensure instructions are followed.  Patient verbalized understanding to all instructions provided and agreed to proceed with procedure.   Advised patient to contact RN Navigator at [phone], to inform of any new medications started after call or concerns prior to procedure.

## 2024-05-14 NOTE — Addendum Note (Signed)
 Addended by: Kingslee Mairena O on: 05/14/2024 11:26 AM   Modules accepted: Orders

## 2024-05-14 NOTE — Telephone Encounter (Signed)
 Informed patient of BMP result note per provider. Patient states she does not take a potassium supplement and takes Lokelma  twice a week per nephrology instructions. She is mindful of monitoring what she eats. Advised patient to continue to try to limit or avoid high potassium foods such as: Oranges, nectarines, bananas, cantaloupe, honeydew, kiwifruit, asparagus, avocados, potatoes, tomatoes or tomato sauce, cooked spinach, nuts, red meat and fish. She voiced understanding. BMP order placed. Patient will repeat lab work on this week.

## 2024-05-16 LAB — BASIC METABOLIC PANEL WITH GFR
BUN/Creatinine Ratio: 19 (ref 12–28)
BUN: 26 mg/dL (ref 8–27)
CO2: 19 mmol/L — ABNORMAL LOW (ref 20–29)
Calcium: 9.6 mg/dL (ref 8.7–10.3)
Chloride: 109 mmol/L — ABNORMAL HIGH (ref 96–106)
Creatinine, Ser: 1.37 mg/dL — ABNORMAL HIGH (ref 0.57–1.00)
Glucose: 97 mg/dL (ref 70–99)
Potassium: 5.1 mmol/L (ref 3.5–5.2)
Sodium: 142 mmol/L (ref 134–144)
eGFR: 38 mL/min/1.73 — ABNORMAL LOW (ref >59)

## 2024-05-20 ENCOUNTER — Ambulatory Visit: Payer: Self-pay | Admitting: Cardiovascular Disease

## 2024-05-20 NOTE — Pre-Procedure Instructions (Signed)
 Instructed patient on the following items: Arrival time 0730 Nothing to eat or drink after midnight No meds AM of procedure Responsible person to drive you home and stay with you for 24 hrs  Have you missed any doses of anti-coagulant Eliquis - takes twice a day, hasn't missed any doses in last 4 weeks.  Don't take dose morning of procedure.

## 2024-05-21 ENCOUNTER — Other Ambulatory Visit: Payer: Self-pay

## 2024-05-21 ENCOUNTER — Encounter (HOSPITAL_COMMUNITY): Payer: Self-pay

## 2024-05-21 ENCOUNTER — Ambulatory Visit (HOSPITAL_COMMUNITY): Payer: Self-pay

## 2024-05-21 ENCOUNTER — Ambulatory Visit (HOSPITAL_COMMUNITY)
Admission: RE | Admit: 2024-05-21 | Discharge: 2024-05-21 | Disposition: A | Discharge status: Home / Self Care | Attending: Cardiovascular Disease | Admitting: Cardiovascular Disease

## 2024-05-21 ENCOUNTER — Ambulatory Visit (HOSPITAL_COMMUNITY)
Admission: RE | Disposition: A | Discharge status: Home / Self Care | Payer: Self-pay | Source: Home / Self Care | Attending: Cardiovascular Disease

## 2024-05-21 DIAGNOSIS — I4891 Unspecified atrial fibrillation: Secondary | ICD-10-CM

## 2024-05-21 DIAGNOSIS — Z7901 Long term (current) use of anticoagulants: Secondary | ICD-10-CM | POA: Insufficient documentation

## 2024-05-21 DIAGNOSIS — N183 Chronic kidney disease, stage 3 unspecified: Secondary | ICD-10-CM | POA: Diagnosis not present

## 2024-05-21 DIAGNOSIS — H409 Unspecified glaucoma: Secondary | ICD-10-CM | POA: Diagnosis not present

## 2024-05-21 DIAGNOSIS — I1310 Hypertensive heart and chronic kidney disease without heart failure, with stage 1 through stage 4 chronic kidney disease, or unspecified chronic kidney disease: Secondary | ICD-10-CM | POA: Insufficient documentation

## 2024-05-21 DIAGNOSIS — I350 Nonrheumatic aortic (valve) stenosis: Secondary | ICD-10-CM | POA: Diagnosis not present

## 2024-05-21 DIAGNOSIS — I480 Paroxysmal atrial fibrillation: Secondary | ICD-10-CM | POA: Diagnosis not present

## 2024-05-21 DIAGNOSIS — Z6836 Body mass index (BMI) 36.0-36.9, adult: Secondary | ICD-10-CM | POA: Insufficient documentation

## 2024-05-21 DIAGNOSIS — I495 Sick sinus syndrome: Secondary | ICD-10-CM | POA: Diagnosis not present

## 2024-05-21 DIAGNOSIS — D6869 Other thrombophilia: Secondary | ICD-10-CM | POA: Insufficient documentation

## 2024-05-21 DIAGNOSIS — I4892 Unspecified atrial flutter: Secondary | ICD-10-CM | POA: Insufficient documentation

## 2024-05-21 DIAGNOSIS — E039 Hypothyroidism, unspecified: Secondary | ICD-10-CM | POA: Insufficient documentation

## 2024-05-21 HISTORY — PX: A-FLUTTER ABLATION: EP1230

## 2024-05-21 HISTORY — PX: ATRIAL FIBRILLATION ABLATION: EP1191

## 2024-05-21 LAB — POCT ACTIVATED CLOTTING TIME: Activated Clotting Time: 307 s

## 2024-05-21 SURGERY — ATRIAL FIBRILLATION ABLATION
Anesthesia: General

## 2024-05-21 MED ORDER — FENTANYL CITRATE (PF) 100 MCG/2ML IJ SOLN: 25.0000 ug | INTRAMUSCULAR | Status: DC | PRN

## 2024-05-21 MED ORDER — ONDANSETRON HCL 4 MG/2ML IJ SOLN: 4.0000 mg | Freq: Once | INTRAMUSCULAR | Status: DC | PRN

## 2024-05-21 MED ORDER — HEPARIN SODIUM (PORCINE) 1000 UNIT/ML IJ SOLN
INTRAMUSCULAR | Status: AC
  Filled 2024-05-21: qty 10

## 2024-05-21 MED ORDER — OXYCODONE HCL 5 MG/5ML PO SOLN: 5.0000 mg | Freq: Once | ORAL | Status: DC | PRN

## 2024-05-21 MED ORDER — SUGAMMADEX SODIUM 200 MG/2ML IV SOLN
INTRAVENOUS | Status: DC | PRN
  Administered 2024-05-21 (×2): 100 mg via INTRAVENOUS

## 2024-05-21 MED ORDER — LIDOCAINE 2% (20 MG/ML) 5 ML SYRINGE
INTRAMUSCULAR | Status: DC | PRN
  Administered 2024-05-21: 100 mg via INTRAVENOUS

## 2024-05-21 MED ORDER — ONDANSETRON HCL 4 MG/2ML IJ SOLN
INTRAMUSCULAR | Status: DC | PRN
  Administered 2024-05-21: 4 mg via INTRAVENOUS

## 2024-05-21 MED ORDER — OXYCODONE HCL 5 MG PO TABS: 5.0000 mg | ORAL_TABLET | Freq: Once | ORAL | Status: DC | PRN

## 2024-05-21 MED ORDER — DEXAMETHASONE SOD PHOSPHATE PF 10 MG/ML IJ SOLN
INTRAMUSCULAR | Status: DC | PRN
  Administered 2024-05-21: 5 mg via INTRAVENOUS

## 2024-05-21 MED ORDER — ATROPINE SULFATE 1 MG/10ML IJ SOSY
PREFILLED_SYRINGE | INTRAMUSCULAR | Status: DC | PRN
  Administered 2024-05-21: 1 mg via INTRAVENOUS

## 2024-05-21 MED ORDER — ACETAMINOPHEN 10 MG/ML IV SOLN: 1000.0000 mg | Freq: Once | INTRAVENOUS | Status: DC | PRN

## 2024-05-21 MED ORDER — ONDANSETRON HCL 4 MG/2ML IJ SOLN: 4.0000 mg | Freq: Four times a day (QID) | INTRAMUSCULAR | Status: DC | PRN

## 2024-05-21 MED ORDER — PROTAMINE SULFATE 10 MG/ML IV SOLN
INTRAVENOUS | Status: DC | PRN
  Administered 2024-05-21: 50 mg via INTRAVENOUS

## 2024-05-21 MED ORDER — PHENYLEPHRINE 80 MCG/ML (10ML) SYRINGE FOR IV PUSH (FOR BLOOD PRESSURE SUPPORT)
PREFILLED_SYRINGE | INTRAVENOUS | Status: DC | PRN
  Administered 2024-05-21: 40 ug via INTRAVENOUS
  Administered 2024-05-21: 120 ug via INTRAVENOUS

## 2024-05-21 MED ORDER — CEFAZOLIN SODIUM-DEXTROSE 2-3 GM-%(50ML) IV SOLR
INTRAVENOUS | Status: DC | PRN
  Administered 2024-05-21: 2 g via INTRAVENOUS

## 2024-05-21 MED ORDER — SODIUM CHLORIDE 0.9% FLUSH: 3.0000 mL | INTRAVENOUS | Status: DC | PRN

## 2024-05-21 MED ORDER — ROCURONIUM BROMIDE 10 MG/ML (PF) SYRINGE
PREFILLED_SYRINGE | INTRAVENOUS | Status: DC | PRN
  Administered 2024-05-21: 60 mg via INTRAVENOUS

## 2024-05-21 MED ORDER — CEFAZOLIN SODIUM-DEXTROSE 2-4 GM/100ML-% IV SOLN
INTRAVENOUS | Status: DC
  Filled 2024-05-21: qty 100

## 2024-05-21 MED ORDER — SODIUM CHLORIDE 0.9% FLUSH: 3.0000 mL | Freq: Two times a day (BID) | INTRAVENOUS | Status: DC

## 2024-05-21 MED ORDER — HEPARIN SODIUM (PORCINE) 1000 UNIT/ML IJ SOLN
INTRAMUSCULAR | Status: DC | PRN
  Administered 2024-05-21: 13000 [IU] via INTRAVENOUS

## 2024-05-21 MED ORDER — ACETAMINOPHEN 325 MG PO TABS: 650.0000 mg | ORAL_TABLET | ORAL | Status: DC | PRN

## 2024-05-21 MED ORDER — HEPARIN (PORCINE) IN NACL 1000-0.9 UT/500ML-% IV SOLN
INTRAVENOUS | Status: DC | PRN
  Administered 2024-05-21 (×3): 500 mL

## 2024-05-21 MED ORDER — PROPOFOL 10 MG/ML IV BOLUS
INTRAVENOUS | Status: DC | PRN
  Administered 2024-05-21: 120 mg via INTRAVENOUS

## 2024-05-21 MED ORDER — SODIUM CHLORIDE 0.9 % IV SOLN: 250.0000 mL | INTRAVENOUS | Status: DC | PRN

## 2024-05-21 MED ORDER — FENTANYL CITRATE (PF) 100 MCG/2ML IJ SOLN
INTRAMUSCULAR | Status: AC
  Filled 2024-05-21: qty 2

## 2024-05-21 MED ORDER — SODIUM CHLORIDE 0.9 % IV SOLN: INTRAVENOUS | Status: DC

## 2024-05-21 MED ORDER — PHENYLEPHRINE HCL-NACL 20-0.9 MG/250ML-% IV SOLN
INTRAVENOUS | Status: DC | PRN
  Administered 2024-05-21: 40 ug/min via INTRAVENOUS

## 2024-05-21 MED ORDER — FENTANYL CITRATE (PF) 250 MCG/5ML IJ SOLN
INTRAMUSCULAR | Status: DC | PRN
  Administered 2024-05-21: 100 ug via INTRAVENOUS

## 2024-05-21 SURGICAL SUPPLY — 20 items
CABLE FARASTAR GEN2 SNGL USE (CABLE) IMPLANT
CATH BI DIR 7FR CS F-J 12 PIN (CATHETERS) IMPLANT
CATH EZ STEER NAV 4MM D-F CUR (ABLATOR) IMPLANT
CATH FARAWAVE 2.0 31 (CATHETERS) IMPLANT
CATH GE 8FR SOUNDSTAR (CATHETERS) IMPLANT
CATH OCTARAY 2.0 F 3-3-3-3-3 (CATHETERS) IMPLANT
CATH SMTCH THERMOCOOL SF DF (CATHETERS) IMPLANT
CLOSURE PERCLOSE PROSTYLE (Vascular Products) IMPLANT
COVER SWIFTLINK CONNECTOR (BAG) ×1 IMPLANT
DEVICE CLOSURE MYNXGRIP 6/7F (Vascular Products) IMPLANT
DILATOR VESSEL 38 20CM 16FR (INTRODUCER) IMPLANT
GUIDEWIRE INQWIRE 1.5J.035X260 (WIRE) IMPLANT
KIT VERSACROSS CNCT FARADRIVE (KITS) IMPLANT
PACK EP LF (CUSTOM PROCEDURE TRAY) ×1 IMPLANT
PAD DEFIB RADIO PHYSIO CONN (PAD) ×1 IMPLANT
PATCH CARTO3 (PAD) IMPLANT
SHEATH FARADRIVE STEERABLE (SHEATH) IMPLANT
SHEATH PINNACLE 8F 10CM (SHEATH) IMPLANT
SHEATH PINNACLE 9F 10CM (SHEATH) IMPLANT
SHEATH PROBE COVER 6X72 (BAG) IMPLANT

## 2024-05-21 NOTE — Anesthesia Postprocedure Evaluation (Signed)
 Anesthesia Post Note  Patient: Alexandra Dixon  Procedure(s) Performed: ATRIAL FIBRILLATION ABLATION A-FLUTTER ABLATION     Patient location during evaluation: PACU Anesthesia Type: General Level of consciousness: awake and alert Pain management: pain level controlled Vital Signs Assessment: post-procedure vital signs reviewed and stable Respiratory status: spontaneous breathing, nonlabored ventilation, respiratory function stable and patient connected to nasal cannula oxygen Cardiovascular status: blood pressure returned to baseline and stable Postop Assessment: no apparent nausea or vomiting Anesthetic complications: no   There were no known notable events for this encounter.  Last Vitals:  Vitals:   05/21/24 1330 05/21/24 1400  BP: (!) 138/55 (!) 128/57  Pulse: 62 (!) 57  Resp: 12 12  Temp:    SpO2: 100% 100%    Last Pain:  Vitals:   05/21/24 1400  TempSrc:   PainSc: 0-No pain                 Lynwood MARLA Cornea

## 2024-05-21 NOTE — Progress Notes (Signed)
 Discharge instructions reviewed with patient and Daughter at bedside. Denies questions concerns. PT tolerated PO intake. Ambulated in the hallway, was able to void without difficulty. Seen by MD. Incision site remains clean dry and intact. No s/s of complications. PT escorted from the unit via wheel chair to personal vehicle.

## 2024-05-21 NOTE — Transfer of Care (Signed)
 Immediate Anesthesia Transfer of Care Note  Patient: Alexandra Dixon  Procedure(s) Performed: ATRIAL FIBRILLATION ABLATION A-FLUTTER ABLATION  Patient Location: PACU and Cath Lab  Anesthesia Type:General  Level of Consciousness: awake, alert , and oriented  Airway & Oxygen Therapy: Patient Spontanous Breathing  Post-op Assessment: Report given to RN and Post -op Vital signs reviewed and stable  Post vital signs: Reviewed and stable  Last Vitals:  Vitals Value Taken Time  BP 133/53 05/21/24 11:46  Temp    Pulse 68 05/21/24 11:49  Resp 18 05/21/24 11:49  SpO2 98 % 05/21/24 11:49  Vitals shown include unfiled device data.  Last Pain:  Vitals:   05/21/24 0816  TempSrc: Oral  PainSc:          Complications: There were no known notable events for this encounter.

## 2024-05-21 NOTE — Discharge Instructions (Signed)

## 2024-05-21 NOTE — Anesthesia Procedure Notes (Signed)
 Procedure Name: Intubation Date/Time: 05/21/2024 10:12 AM  Performed by: Roslynn Waddell LABOR, CRNAPre-anesthesia Checklist: Patient identified, Emergency Drugs available, Suction available and Patient being monitored Patient Re-evaluated:Patient Re-evaluated prior to induction Oxygen Delivery Method: Circle System Utilized Preoxygenation: Pre-oxygenation with 100% oxygen Induction Type: IV induction Ventilation: Oral airway inserted - appropriate to patient size Laryngoscope Size: Mac and 3 Grade View: Grade I Tube type: Oral Tube size: 7.0 mm Number of attempts: 1 Airway Equipment and Method: Stylet and Oral airway Placement Confirmation: ETT inserted through vocal cords under direct vision, positive ETCO2 and breath sounds checked- equal and bilateral Secured at: 21 cm Tube secured with: Tape Dental Injury: Teeth and Oropharynx as per pre-operative assessment  Comments: Atraumatic induction/intubation. Dentition and oral mucosa as per preop (edentulous outside of 4 studs that were undamaged and as per preop during intubation)

## 2024-05-21 NOTE — Anesthesia Preprocedure Evaluation (Signed)
 Anesthesia Evaluation  Patient identified by MRN, date of birth, ID band Patient awake    Reviewed: Allergy & Precautions, NPO status , Patient's Chart, lab work & pertinent test results, reviewed documented beta blocker date and time   History of Anesthesia Complications (+) PONV and history of anesthetic complications  Airway Mallampati: III  TM Distance: >3 FB   Mouth opening: Limited Mouth Opening  Dental  (+) Partial Upper   Pulmonary neg COPD   breath sounds clear to auscultation       Cardiovascular hypertension, (-) CAD, (-) Past MI, (-) Cardiac Stents and (-) CABG + dysrhythmias Atrial Fibrillation + Valvular Problems/Murmurs  Rhythm:Regular Rate:Normal  IMPRESSIONS     1. Left ventricular ejection fraction, by estimation, is 65 to 70%. Left  ventricular ejection fraction by 3D volume is 64 %. The left ventricle has  normal function. The left ventricle has no regional wall motion  abnormalities. There is severe asymmetric   left ventricular hypertrophy of the basal-septal segment. Left  ventricular diastolic parameters are indeterminate.   2. Right ventricular systolic function is normal. The right ventricular  size is mildly enlarged. There is normal pulmonary artery systolic  pressure.   3. Left atrial size was severely dilated.   4. The mitral valve is degenerative. Moderate mitral valve regurgitation.   5. The aortic valve is calcified. Aortic valve regurgitation is mild.  Mild aortic valve stenosis. Aortic regurgitation PHT measures 503 msec.  Aortic valve mean gradient measures 19.0 mmHg. Aortic valve Vmax measures  2.87 m/s.   6. The inferior vena cava is normal in size with greater than 50%  respiratory variability, suggesting right atrial pressure of 3 mmHg.     Neuro/Psych neg Seizures    GI/Hepatic ,neg GERD  ,,(+) neg Cirrhosis        Endo/Other  Hypothyroidism    Renal/GU CRFRenal disease      Musculoskeletal   Abdominal   Peds  Hematology  (+) Blood dyscrasia, anemia   Anesthesia Other Findings   Reproductive/Obstetrics                              Anesthesia Physical Anesthesia Plan  ASA: 2  Anesthesia Plan: General   Post-op Pain Management:    Induction: Intravenous  PONV Risk Score and Plan: 3 and Ondansetron  and Dexamethasone  Airway Management Planned: LMA  Additional Equipment:   Intra-op Plan:   Post-operative Plan: Extubation in OR  Informed Consent: I have reviewed the patients History and Physical, chart, labs and discussed the procedure including the risks, benefits and alternatives for the proposed anesthesia with the patient or authorized representative who has indicated his/her understanding and acceptance.     Dental advisory given  Plan Discussed with: CRNA  Anesthesia Plan Comments:          Anesthesia Quick Evaluation

## 2024-05-21 NOTE — H&P (Signed)
 Electrophysiology Office Note:    Date:  05/21/2024   ID:  VENNELA JUTTE, DOB 1935-07-28, MRN [id number]  PCP:  Benjamine Aland, MD   [facility] Providers Cardiologist:  Jerel Balding, MD     Referring MD: No ref. provider found   History of Present Illness:    Alexandra Dixon is a 88 y.o. female with a medical history significant for atrial flutter, atrial fibrillation, morbid obesity hypertension, mild aortic stenosis, first-degree AV block, CKD 3, referred for arrhythmia management.     Discussed the use of AI scribe software for clinical note transcription with the patient, who gave verbal consent to proceed.  History of Present Illness Alexandra Dixon is an 88 year old female with atrial fibrillation and atrial flutter who presents for evaluation of her arrhythmias.  Atrial fibrillation was initially detected by her smart watch and confirmed by EKG. A monitor in October 2024 showed a low burden of atrial fibrillation, which she was unaware of at the time. Recently, she experienced an episode of atrial flutter requiring emergency room treatment without overnight admission. Rates are uncontrolled in AF.  A heart monitor in October 2024 indicated sinus rhythm predominance with a heart rate ranging from 38 to 81 beats per minute, averaging 52 beats per minute. There was 1% atrial fibrillation with an average rate of 128 beats per minute and end rates up to 166 beats per minute.    She is currently on Eliquis .         Today, she reports that she feels reasonly well --no palpitations, shortness of breath, fatigue.  I reviewed the patient's CT and labs. There was no LAA thrombus. she  has not missed any doses of anticoagulation, and she took her dose last night. There have been no changes in the patient's diagnoses, medications, or condition since our recent clinic visit.   EKGs/Labs/Other Studies Reviewed Today:     Echocardiogram:  TTE April 11, 2023 LVEF 65 to  70%.  There is LVH of the septal basal segment.  Left atrium is severely dilated.  Mitral valve is degenerative.  Mild aortic valve stenosis.   Monitors:  12 day monitor October 2024-- my interpretation Sinus rhythm predominates, heart rate 38 to 81 bpm.  Average 52 bpm 1% burden of atrial fibrillation with an average rate of 128 bpm and rates up to 166 bpm.  EKG:         Physical Exam:    VS:  BP (!) 168/60   Pulse (!) 56   Temp 98.2 F (36.8 C) (Oral)   Resp 17   Ht 4' 11 (1.499 m)   Wt 81.6 kg   SpO2 98%   BMI 36.36 kg/m     Wt Readings from Last 3 Encounters:  05/21/24 81.6 kg  02/15/24 80.3 kg  02/07/24 81.2 kg     GEN: Well nourished, well developed in no acute distress CARDIAC: RRR, no murmurs, rubs, gallops RESPIRATORY:  Normal work of breathing MUSCULOSKELETAL: no edema    ASSESSMENT & PLAN:     Paroxysmal atrial fibrillation and atrial flutter Low burden Atrial fibrillation is reasonably well tolerated, but she does not well-tolerate the faster ventricular rates of flutter Flutter will be difficult to control medically, and I suspect, would require a rhythm control approach. This would likely entail either ablation or amiodarone and possible a pacemaker as her renal function is tenuous for tikosyn. Left atrium is severely enlarged Acute medicated with Dr. Balding, and we  concluded that the optimal approach would be to perform the atrial flutter ablation and also perform atrial fibrillation ablation at the same time. I had another discussion today with the patient and her daughter.  Using a shared decision making approach, we opted to schedule the ablation for atrial fibrillation and flutter.  We discussed the indication, rationale, logistics, anticipated benefits, and potential risks of the ablation procedure including but not limited to -- bleed at the groin access site, chest pain, damage to nearby organs such as the diaphragm, lungs, or esophagus,  need for a drainage tube, or prolonged hospitalization. I explained that the risk for stroke, heart attack, need for open chest surgery, or even death is very low but not zero. she  expressed understanding and wishes to proceed.   Sinus bradycardia Limits medical therapy Tachy-brady syndrome  Secondary hypercoagulable state Continue Eliquis  2.5 mg p.o. twice daily  Mild to moderate aortic stenosis  Obesity Weight loss advised  Glaucoma Cardiac stratification has already been performed by our office (see encounters tab).  From an EP perspective, she will need to remain uninterrupted anticoagulation for the month prior to ablation in 3 months after ablation.  She does not need to delay her surgery until after the ablation.  Signed, Eulas FORBES Furbish, MD  05/21/2024 9:42 AM    [facility]

## 2024-05-22 ENCOUNTER — Encounter (HOSPITAL_COMMUNITY): Payer: Self-pay | Admitting: Cardiovascular Disease

## 2024-05-22 ENCOUNTER — Telehealth (HOSPITAL_COMMUNITY): Payer: Self-pay

## 2024-05-22 MED FILL — Fentanyl Citrate Preservative Free (PF) Inj 100 MCG/2ML: INTRAMUSCULAR | Qty: 2 | Status: CN

## 2024-05-22 MED FILL — Cefazolin Sodium-Dextrose IV Solution 2 GM/100ML-4%: INTRAVENOUS | Qty: 100 | Status: CN

## 2024-05-22 MED FILL — Cefazolin Sodium-Dextrose IV Solution 2 GM/100ML-4%: INTRAVENOUS | Qty: 100 | Status: AC

## 2024-05-22 MED FILL — Fentanyl Citrate Preservative Free (PF) Inj 100 MCG/2ML: INTRAMUSCULAR | Qty: 2 | Status: AC

## 2024-05-22 NOTE — Telephone Encounter (Signed)
 Spoke with patient to complete post procedure follow up call.  Patient reports no complications with groin sites.   Instructions reviewed with patient:  Remove large bandage at puncture site after 24 hours. It is normal to have bruising, tenderness, mild swelling, and a pea or marble sized lump/knot at the groin site which can take up to three months to resolve.  Get help right away if you notice sudden swelling at the puncture site.  Check your puncture site every day for signs of infection: fever, redness, swelling, pus drainage, warmth, foul odor or excessive pain. If this occurs, please call [phone], to speak with the RN Navigator. Get help right away if your puncture site is bleeding and the bleeding does not stop after applying firm pressure to the area.  You may continue to have skipped beats/ atrial fibrillation during the first several months after your procedure.  It is very important not to miss any doses of your blood thinner Eliquis .    You will follow up with the Afib clinic 4 weeks after your procedure and follow up with the APP or Dr. Nancey 3 months after your procedure.  Activity restrictions reviewed.  Patient verbalized understanding to all instructions provided.

## 2024-06-18 ENCOUNTER — Ambulatory Visit (HOSPITAL_COMMUNITY)
Admission: RE | Admit: 2024-06-18 | Discharge: 2024-06-18 | Disposition: A | Discharge status: Home / Self Care | Source: Ambulatory Visit | Attending: Internal Medicine | Admitting: Internal Medicine

## 2024-06-18 ENCOUNTER — Encounter (HOSPITAL_COMMUNITY): Payer: Self-pay | Admitting: Internal Medicine

## 2024-06-18 VITALS — BP 136/60 | HR 51 | Ht 59.0 in | Wt 181.8 lb

## 2024-06-18 DIAGNOSIS — I351 Nonrheumatic aortic (valve) insufficiency: Secondary | ICD-10-CM

## 2024-06-18 DIAGNOSIS — I10 Essential (primary) hypertension: Secondary | ICD-10-CM | POA: Diagnosis not present

## 2024-06-18 DIAGNOSIS — R001 Bradycardia, unspecified: Secondary | ICD-10-CM

## 2024-06-18 DIAGNOSIS — I480 Paroxysmal atrial fibrillation: Secondary | ICD-10-CM | POA: Diagnosis not present

## 2024-06-18 DIAGNOSIS — D6859 Other primary thrombophilia: Secondary | ICD-10-CM

## 2024-06-18 NOTE — Progress Notes (Signed)
 "  Primary Care Physician: Benjamine Aland, MD Primary Cardiologist: Jerel Balding, MD Electrophysiologist: None  Referring Physician: Dr. Nancey Jori LELON Alexandra Dixon is a 88 y.o. female with a history of paroxysmal AF (on Eliquis )s/p atrial flutter/A-fib ablation, HTN, HLD, mild AS, CKD stage III, first-degree AVB who presents for follow up in the [facility].  The patient was initially diagnosed with atrial fibrillation in 2024 when her smart watch showed episodes of A-fib that were confirmed by EKG at PCP office.  2D echo was completed showing LVH of septal basal segment with left atrium severely dilated and mitral valve degeneration and mild aortic valve stenosis.  She was essentially asymptomatic and continue to monitor with her smart watch.  She was started on Eliquis  2.5 mg twice daily and was limited with rate control strategies due to concurrent sinus bradycardia.  She was referred to EP due to episode of atrial flutter requiring ED visit and was evaluated by Dr. Nancey.  Shared decision was made with patient and her daughter to pursue ablation which was completed on 05/22/2024 with CTI and PVI completed with pulsed field energy.  Alexandra Dixon presents today for post ablation follow-up.  She reports since her procedure no episodes of palpitations or heart racing.  She has been compliant with her Eliquis  and reports no missed doses.  She also reports that her groin sites have healed and has no evidence of hematoma but reports slight soreness and mild bruising.  She has resumed her normal activity and states that she has more energy following the procedure.  During today's visit we discussed the importance of compliance with her anticoagulant during the post 40-month period.  She was also made aware of the possibility of recurrent episodes of tachycardia or A-fib.  We also briefly discussed triggers for A-fib and she was made aware that sleep apnea could be a trigger.  She reports  that she does not snore and has no episodes of daytime somnolence.    Today, she denies symptoms of palpitations, chest pain, shortness of breath, orthopnea, PND, lower extremity edema, dizziness, presyncope, syncope, snoring, daytime somnolence, bleeding, or neurologic sequela. The patient is tolerating medications without difficulties and is otherwise without complaint today.    Atrial Fibrillation Risk Factors:  No snoring or daytime somnolence noted Atrial Fibrillation Management history:  Previous antiarrhythmic drugs: None Previous cardioversions: None Previous ablations: 05/21/2024 Anticoagulation history: Eliquis    ROS- All systems are reviewed and negative except as per the HPI above.  Past Medical History:  Diagnosis Date   Glaucoma    Heart murmur    Hyperlipemia    Hypothyroidism    Obesity    Sinus bradycardia    Systemic hypertension     Current Outpatient Medications  Medication Sig Dispense Refill   apixaban  (ELIQUIS ) 2.5 MG TABS tablet TAKE 1 TABLET BY MOUTH TWICE A DAY 60 tablet 6   diltiazem  (CARDIZEM ) 30 MG tablet Take 1 tablet (30 mg total) by mouth as needed (for sustained rapid palpitations over 120 bpm). May take 1 additional tablet in 60 minutes if heart rate is still elevated. 15 tablet 0   dorzolamide (TRUSOPT) 2 % ophthalmic solution Place 1 drop into both eyes 2 (two) times daily.     doxazosin  (CARDURA ) 1 MG tablet Take 1 mg by mouth at bedtime.     furosemide (LASIX) 20 MG tablet 20 mg as needed. Take 1 Tablet Daily As Needed for Swelling or weight of 3 lbs  Over Night and 5 lbs. In a Week.     levothyroxine (SYNTHROID, LEVOTHROID) 50 MCG tablet Take 50 mcg by mouth daily before breakfast. Except Wednesdays and Sundays.     lisinopril-hydrochlorothiazide (PRINZIDE,ZESTORETIC) 20-25 MG per tablet Take 1 tablet by mouth daily.     LOKELMA  5 g packet Take 1 packet by mouth 2 (two) times a week.     metoprolol  succinate (TOPROL -XL) 25 MG 24 hr tablet  TAKE 1 TABLET (25 MG TOTAL) BY MOUTH DAILY. 90 tablet 3   Multiple Vitamin (MULTIVITAMIN) tablet Take 1 tablet by mouth daily.     rosuvastatin (CRESTOR) 20 MG tablet Take 20 mg by mouth daily.     Vibegron (GEMTESA) 75 MG TABS Take 75 mg by mouth. Take 1 Tablet daily     No current facility-administered medications for this encounter.    Physical Exam: BP 136/60   Pulse (!) 51   Ht 4' 11 (1.499 m)   Wt 82.5 kg   BMI 36.72 kg/m   GEN: Well nourished, well developed in no acute distress NECK: No JVD; No carotid bruits CARDIAC: Regular rate and rhythm, no murmurs, rubs, gallops RESPIRATORY:  Clear to auscultation without rales, wheezing or rhonchi  ABDOMEN: Soft, non-tender, non-distended EXTREMITIES:  No edema; No deformity   Wt Readings from Last 3 Encounters:  06/18/24 82.5 kg  05/21/24 81.6 kg  02/15/24 80.3 kg     EKG today demonstrates:  EKG Interpretation Date/Time:  Tuesday June 18 2024 13:33:28 EST Ventricular Rate:  51 PR Interval:  228 QRS Duration:  70 QT Interval:  430 QTC Calculation: 396 R Axis:   22  Text Interpretation: Sinus bradycardia with 1st degree A-V block When compared with ECG of 21-May-2024 11:58, No significant change was found Confirmed by Wyn Manus [phone]) on 06/18/2024 2:33:35 PM    Echo Completed 04/12/24: 1. Left ventricular ejection fraction, by estimation, is 65 to 70%. Left  ventricular ejection fraction by 3D volume is 64 %. The left ventricle has  normal function. The left ventricle has no regional wall motion  abnormalities. There is severe asymmetric   left ventricular hypertrophy of the basal-septal segment. Left  ventricular diastolic parameters are indeterminate.   2. Right ventricular systolic function is normal. The right ventricular  size is mildly enlarged. There is normal pulmonary artery systolic  pressure.   3. Left atrial size was severely dilated.   4. The mitral valve is degenerative. Moderate mitral valve  regurgitation.   5. The aortic valve is calcified. Aortic valve regurgitation is mild.  Mild aortic valve stenosis. Aortic regurgitation PHT measures 503 msec.  Aortic valve mean gradient measures 19.0 mmHg. Aortic valve Vmax measures  2.87 m/s.   6. The inferior vena cava is normal in size with greater than 50%  respiratory variability, suggesting right atrial pressure of 3 mmHg.   CHA2DS2-VASc Score = 4  The patient's score is based upon: CHF History: 0 HTN History: 1 Diabetes History: 0 Stroke History: 0 Vascular Disease History: 0 Age Score: 2 Gender Score: 1       ASSESSMENT AND PLAN: Paroxysmal Atrial Fibrillation (ICD10:  I48.0) The patient's CHA2DS2-VASc score is 4, indicating a 4.8% annual risk of stroke.   -s/p PVI and flutter ablation completed on 05/21/2024 and today patient is maintaining sinus rhythm and reports increase in energy since procedure. -Groin sites are stable bilaterally with no complications noted -Patient advised to continue all medications without interruptions especially during post ablation period. -  Continue Eliquis  2.5 mg twice daily - Continue Toprol -XL 25 mg daily  Secondary Hypercoagulable State (ICD10:  D68.69) The patient is at significant risk for stroke/thromboembolism based upon her CHA2DS2-VASc Score of 4.  Continue Apixaban  (Eliquis ).   HTN: BP well controlled. Continue current antihypertensive regimen.   VHD: - Mild to moderate aortic stenosis  Sinus bradycardia: - Tachybradycardia syndrome  Signed,  Wyn Raddle, Jackee Shove, NP    06/18/2024 2:33 PM     Follow up with EP as scheduled.  "

## 2024-08-20 ENCOUNTER — Ambulatory Visit: Admitting: Cardiology
